# Patient Record
Sex: Male | Born: 1955 | Race: White | Hispanic: No | Marital: Married | Smoking: Never smoker
Health system: Southern US, Community
[De-identification: ages and names within clinical notes are randomized; demographics above are authoritative.]

## PROBLEM LIST (undated history)

## (undated) DIAGNOSIS — M1990 Unspecified osteoarthritis, unspecified site: Secondary | ICD-10-CM

## (undated) DIAGNOSIS — I10 Essential (primary) hypertension: Secondary | ICD-10-CM

## (undated) DIAGNOSIS — K219 Gastro-esophageal reflux disease without esophagitis: Secondary | ICD-10-CM

## (undated) DIAGNOSIS — E119 Type 2 diabetes mellitus without complications: Secondary | ICD-10-CM

## (undated) HISTORY — PX: OTHER SURGICAL HISTORY: SHX169

---

## 2005-10-01 ENCOUNTER — Ambulatory Visit (HOSPITAL_BASED_OUTPATIENT_CLINIC_OR_DEPARTMENT_OTHER): Admission: RE | Admit: 2005-10-01 | Discharge: 2005-10-01 | Payer: Self-pay | Admitting: Plastic Surgery

## 2005-10-01 ENCOUNTER — Encounter (INDEPENDENT_AMBULATORY_CARE_PROVIDER_SITE_OTHER): Payer: Self-pay | Admitting: Specialist

## 2009-10-10 ENCOUNTER — Encounter: Admission: RE | Admit: 2009-10-10 | Discharge: 2009-11-07 | Payer: Self-pay | Admitting: Family Medicine

## 2010-09-05 ENCOUNTER — Emergency Department (HOSPITAL_COMMUNITY): Admission: EM | Admit: 2010-09-05 | Discharge: 2010-09-05 | Payer: Self-pay | Admitting: Emergency Medicine

## 2011-03-09 NOTE — Op Note (Signed)
NAME:  Mcevers, [name]                 ACCOUNT NO.:  [id number]   MEDICAL RECORD NO.:  [id number]          PATIENT TYPE:  AMB   LOCATION:  DSC                          FACILITY:  [facility]   PHYSICIAN:  Alfredia Ferguson, M.D.  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   OPERATION PERFORMED:  Partial excision of congenital nevus with primary  closure.   SURGEON:  Alfredia Ferguson, MD.   ANESTHESIA:  1% Xylocaine, 1:100,000 epinephrine, 20 mL used.   INDICATIONS FOR SURGERY:  This is a 55 year old male with a large congenital  nevus, lateral aspect of the left upper thigh.  It measures 9 cm x 3.5 cm.  The plan is to remove as much of this lesion as possible today.  It is  horizontally oriented so it will likely be impossible to remove all of the  lesion.  The patient understands that he will require a second surgery in  the future.  He also understands the risk of unsightly scarring.  It is  better that he wishes to proceed with the surgery.   DESCRIPTION OF OPERATION:  Skin markers were placed in an elliptical fashion  around the lesion.  Local anesthesia was infiltrated, and the area was  prepped with Betadine and draped with sterile drapes.  After waiting 10  minutes, the upper limb of my incision, and the pigmented nevus was  undermined at the level of the subcutaneous tissue going in an inferior  direction.  I undermined the lesion approximately 80% of the way.  The upper  limb of the incision was also undermined for a distance of a couple of  centimeters.  This appeared to be the limit of my excision due to tension.  The lesion was then excised at the inferior limit of my dissection.  The  specimen was submitted for  pathology.  The inferior wound edges were  undermined for a distance of a couple of centimeters to facilitate closure.  Hemostasis was meticulously maintained.  The wound was closed with multiple  interrupted 3-0 Monocryl sutures for the dermis followed by a running 3-0  Monocryl subcuticular suture.  Approximately 10 to 15% of the lesion  remains.  The area was cleansed and dried, and a light dressing was applied.  The patient was discharged home in satisfactory condition.      Alfredia Ferguson, M.D.  Electronically Signed     WBB/MEDQ  D:  10/01/2005  T:  10/01/2005  Job:  [id number]   cc:   [facility]

## 2011-10-25 ENCOUNTER — Other Ambulatory Visit: Payer: Self-pay | Admitting: Family Medicine

## 2011-10-25 DIAGNOSIS — R1011 Right upper quadrant pain: Secondary | ICD-10-CM

## 2011-11-02 ENCOUNTER — Ambulatory Visit
Admission: RE | Admit: 2011-11-02 | Discharge: 2011-11-02 | Disposition: A | Discharge status: Home / Self Care | Payer: BC Managed Care – PPO | Source: Ambulatory Visit | Attending: Family Medicine | Admitting: Family Medicine

## 2011-11-02 DIAGNOSIS — R1011 Right upper quadrant pain: Secondary | ICD-10-CM

## 2012-12-09 ENCOUNTER — Other Ambulatory Visit: Payer: Self-pay | Admitting: Orthopedic Surgery

## 2012-12-09 MED ORDER — BUPIVACAINE LIPOSOME 1.3 % IJ SUSP: 20.0000 mL | Freq: Once | INTRAMUSCULAR | Status: DC

## 2012-12-09 MED ORDER — DEXAMETHASONE SODIUM PHOSPHATE 10 MG/ML IJ SOLN: 10.0000 mg | Freq: Once | INTRAMUSCULAR | Status: DC

## 2012-12-09 NOTE — Progress Notes (Signed)
Preoperative surgical orders have been place into the Epic hospital system for Kevin Lucero on 12/09/2012, 11:48 AM  by Patrica Duel for surgery on 12/31/2012.  Preop Total Hip - Anterior Approach orders including Experel Injecion, IV Tylenol, and IV Decadron as long as there are no contraindications to the above medications. Avel Peace, PA-C

## 2012-12-16 ENCOUNTER — Other Ambulatory Visit: Payer: Self-pay | Admitting: Orthopedic Surgery

## 2012-12-16 NOTE — H&P (Signed)
Kevin Lucero  DOB: 1956-04-10 Married / Language: English / Race: White Male  Date of Admission:  12/31/2012  Chief Complaint:  Right Hip Pain  History of Present Illness The patient is a 57 year old male who comes in for a preoperative History and Physical. The patient is scheduled for a right total hip arthroplasty (Anterior Approach) to be performed by Dr. Gus Rankin. Aluisio, MD at [facility] on 12/31/12. The patient is a 57 year old male who presents for follow up of their hip. The patient is being followed for their right hip pain and osteoarthritis. Symptoms reported today include: pain and stiffness. The patient feels that they are doing poorly and report their pain level to be mild to moderate. The following medication has been used for pain control: none. Kevin Lucero feels the hip has gotten worse since being seen in March. He has more limitations in motion and more limitations in function. There is worsening pain. He is unable to go on walks anymore. The hip will hurt at night. The more activity he does during the day, the worse it hurts at night. He is not having any back pain or lower extremity weakness or paresthesia with this. The left hip will also hurt on occasion but no where near as bad as the right. The hip is now essentially taking over his life as to what he can or can not do. He is ready to get the hip replaced at this time. He discussed doing an anterior approach. We did discuss the intricacies of this and he wants to go ahead and proceed with a total hip arthroplasty via anterior approach. He is an excellent candidate for that based on his lack of significant deformity and based on body habitus. They have been treated conservatively in the past for the above stated problem and despite conservative measures, they continue to have progressive pain and severe functional limitations and dysfunction. They have failed non-operative management including home exercise,  medications, and injections. It is felt that they would benefit from undergoing total joint replacement. Risks and benefits of the procedure have been discussed with the patient and they elect to proceed with surgery. There are no active contraindications to surgery such as ongoing infection or rapidly progressive neurological disease.   Problem List Osteoarthritis, Hip (715.35)   Allergies Penicillins. fevers, "bad dreams"   Family History Rheumatoid Arthritis. father Hypertension. mother and father Diabetes Mellitus. 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 greater than 5 Marital status. married Illicit drug use. no Living situation. live with spouse Pain Contract. no Tobacco use. never smoker; smoke(d) less than 1/2 pack(s) per day; uses less than half 1/2 can(s) smokeless per week Previously in rehab. no Tobacco / smoke exposure. no Drug/Alcohol Rehab (Currently). no Exercise. Exercises daily; does running / walking, individual sport and gym / weights Current work status. working full time Alcohol use. current drinker; drinks beer, wine and hard liquor; 8-14 per week Children. 2 Post-Surgical Plans. Plan is for home.   Medication History Fenofibrate Micronized (200MG  Capsule, Oral) Active. Metoprolol Succinate (50MG  Tablet ER 24HR, Oral) Active. Lisinopril (10MG  Tablet, Oral) Active.   Past Surgical History No pertinent past surgical history   Medical History High blood pressure Diet-Controlled Diabetes Mellitus. Borderline   Review of Systems General:Not Present- Chills, Fever, Night Sweats, Fatigue, Weight Gain, Weight Loss and Memory Loss. Skin:Not Present- Hives, Itching,  Rash, Eczema and Lesions. HEENT:Not Present- Tinnitus, Headache, Double Vision, Visual Loss, Hearing Loss and Dentures. Respiratory:Not  Present- Shortness of breath with exertion, Shortness of breath at rest, Allergies, Coughing up blood and Chronic Cough. Cardiovascular:Not Present- Chest Pain, Racing/skipping heartbeats, Difficulty Breathing Lying Down, Murmur, Swelling and Palpitations. Gastrointestinal:Not Present- Bloody Stool, Heartburn, Abdominal Pain, Vomiting, Nausea, Constipation, Diarrhea, Difficulty Swallowing, Jaundice and Loss of appetitie. Male Genitourinary:Not Present- Urinary frequency, Blood in Urine, Weak urinary stream, Discharge, Flank Pain, Incontinence, Painful Urination, Urgency, Urinary Retention and Urinating at Night. Musculoskeletal:Present- Joint Pain. Not Present- Muscle Weakness, Muscle Pain, Joint Swelling, Back Pain, Morning Stiffness and Spasms. Neurological:Not Present- Tremor, Dizziness, Blackout spells, Paralysis, Difficulty with balance and Weakness. Psychiatric:Not Present- Insomnia.   Vitals Pulse: 76 (Regular) Resp.: 16 (Unlabored) BP: 132/70 (Sitting, Left Arm, Standard)    Physical Exam The physical exam findings are as follows:   General Mental Status - Alert, cooperative and good historian. General Appearance- pleasant. Not in acute distress. Orientation- Oriented X3. Build & Nutrition- Well nourished and Well developed.   Head and Neck Head- normocephalic, atraumatic . Neck Global Assessment- supple. no bruit auscultated on the right and no bruit auscultated on the left.   Eye Pupil- Bilateral- Regular and Round. Motion- Bilateral- EOMI.   Chest and Lung Exam Auscultation: Breath sounds:- clear at anterior chest wall and - clear at posterior chest wall. Adventitious sounds:- No Adventitious sounds.   Cardiovascular Auscultation:Rhythm- Regular rate and rhythm. Heart Sounds- S1 WNL and S2 WNL. Murmurs & Other Heart Sounds:Auscultation of the heart reveals - No Murmurs.   Abdomen Palpation/Percussion:Tenderness-  Abdomen is non-tender to palpation. Rigidity (guarding)- Abdomen is soft. Auscultation:Auscultation of the abdomen reveals - Bowel sounds normal.   Male Genitourinary Not done, not pertinent to present illness  Musculoskeletal He is a well developed male. He is alert and oriented. No apparent distress. He walks with a slightly antalgic gait pattern on the right. His left hip has flexion to 95 degrees, about 5 internal rotation, 20 external rotation and 20 abduction. The right hip flexion to 90. No internal rotation and about 5 external rotation and about 15 abduction. The knee exam is normal bilaterally. Pulse, sensation and motor are intact in both lower extremities.  RADIOGRAPHS: AP of the pelvis and lateral of the right hip shows severe bone on bone arthritis of the right hip with some erosion of the femoral head. This is slightly worse than back in March. He also has significant narrowing in the left hip. This has been unchanged since March.  Assessment & Plan Osteoarthritis, Hip (715.35) Impression: Right Hip  Note: Plan is for a Right Total Hip Replacement - Anterior Approach by Dr. Lequita Halt.  Plan is to go home.  PCP - Dr. Selena Batten Cardiology - Dr. Eldridge Dace  Signed electronically by Roberts Gaudy, PA-C

## 2012-12-17 ENCOUNTER — Encounter (HOSPITAL_COMMUNITY)
Admission: RE | Admit: 2012-12-17 | Discharge: 2012-12-17 | Disposition: A | Discharge status: Home / Self Care | Payer: BC Managed Care – PPO | Source: Ambulatory Visit | Attending: Orthopedic Surgery | Admitting: Orthopedic Surgery

## 2012-12-17 ENCOUNTER — Ambulatory Visit (HOSPITAL_COMMUNITY)
Admission: RE | Admit: 2012-12-17 | Discharge: 2012-12-17 | Disposition: A | Discharge status: Home / Self Care | Payer: BC Managed Care – PPO | Source: Ambulatory Visit | Attending: Orthopedic Surgery | Admitting: Orthopedic Surgery

## 2012-12-17 ENCOUNTER — Encounter (HOSPITAL_COMMUNITY): Payer: Self-pay | Admitting: Pharmacy Technician

## 2012-12-17 ENCOUNTER — Encounter (HOSPITAL_COMMUNITY): Payer: Self-pay

## 2012-12-17 DIAGNOSIS — Z01810 Encounter for preprocedural cardiovascular examination: Secondary | ICD-10-CM | POA: Insufficient documentation

## 2012-12-17 DIAGNOSIS — Z01812 Encounter for preprocedural laboratory examination: Secondary | ICD-10-CM | POA: Insufficient documentation

## 2012-12-17 DIAGNOSIS — I10 Essential (primary) hypertension: Secondary | ICD-10-CM | POA: Insufficient documentation

## 2012-12-17 DIAGNOSIS — Z01818 Encounter for other preprocedural examination: Secondary | ICD-10-CM | POA: Insufficient documentation

## 2012-12-17 HISTORY — DX: Gastro-esophageal reflux disease without esophagitis: K21.9

## 2012-12-17 HISTORY — DX: Unspecified osteoarthritis, unspecified site: M19.90

## 2012-12-17 HISTORY — DX: Essential (primary) hypertension: I10

## 2012-12-17 HISTORY — DX: Type 2 diabetes mellitus without complications: E11.9

## 2012-12-17 LAB — URINALYSIS, ROUTINE W REFLEX MICROSCOPIC
Bilirubin Urine: NEGATIVE
Glucose, UA: NEGATIVE mg/dL
Hgb urine dipstick: NEGATIVE
Ketones, ur: NEGATIVE mg/dL
Leukocytes, UA: NEGATIVE
Nitrite: NEGATIVE
Protein, ur: NEGATIVE mg/dL
Specific Gravity, Urine: 1.022 (ref 1.005–1.030)
Urobilinogen, UA: 0.2 mg/dL (ref 0.0–1.0)
pH: 6.5 (ref 5.0–8.0)

## 2012-12-17 LAB — SURGICAL PCR SCREEN
MRSA, PCR: NEGATIVE
Staphylococcus aureus: POSITIVE — AB

## 2012-12-17 LAB — CBC
HCT: 46 % (ref 39.0–52.0)
Hemoglobin: 15.5 g/dL (ref 13.0–17.0)
MCH: 30.9 pg (ref 26.0–34.0)
MCHC: 33.7 g/dL (ref 30.0–36.0)
MCV: 91.8 fL (ref 78.0–100.0)
Platelets: 305 10*3/uL (ref 150–400)
RBC: 5.01 MIL/uL (ref 4.22–5.81)
RDW: 12.9 % (ref 11.5–15.5)
WBC: 6 10*3/uL (ref 4.0–10.5)

## 2012-12-17 LAB — COMPREHENSIVE METABOLIC PANEL
ALT: 31 U/L (ref 0–53)
AST: 38 U/L — ABNORMAL HIGH (ref 0–37)
Albumin: 4.2 g/dL (ref 3.5–5.2)
Alkaline Phosphatase: 42 U/L (ref 39–117)
BUN: 19 mg/dL (ref 6–23)
CO2: 24 mEq/L (ref 19–32)
Calcium: 9.8 mg/dL (ref 8.4–10.5)
Chloride: 104 mEq/L (ref 96–112)
Creatinine, Ser: 1.25 mg/dL (ref 0.50–1.35)
GFR calc Af Amer: 73 mL/min — ABNORMAL LOW (ref >90)
GFR calc non Af Amer: 63 mL/min — ABNORMAL LOW (ref >90)
Glucose, Bld: 105 mg/dL — ABNORMAL HIGH (ref 70–99)
Potassium: 3.9 mEq/L (ref 3.5–5.1)
Sodium: 138 mEq/L (ref 135–145)
Total Bilirubin: 0.4 mg/dL (ref 0.3–1.2)
Total Protein: 7.8 g/dL (ref 6.0–8.3)

## 2012-12-17 LAB — PROTIME-INR
INR: 0.94 (ref 0.00–1.49)
Prothrombin Time: 12.5 seconds (ref 11.6–15.2)

## 2012-12-17 LAB — APTT: aPTT: 40 seconds — ABNORMAL HIGH (ref 24–37)

## 2012-12-17 NOTE — Progress Notes (Signed)
12/17/12 1659  OBSTRUCTIVE SLEEP APNEA  Have you ever been diagnosed with sleep apnea through a sleep study? No  Do you snore loudly (loud enough to be heard through closed doors)?  0  Do you often feel tired, fatigued, or sleepy during the daytime? 1  Has anyone observed you stop breathing during your sleep? 0  Do you have, or are you being treated for high blood pressure? 1  BMI more than 35 kg/m2? 0  Age over 57 years old? 1  Neck circumference greater than 40 cm/18 inches? 0  Gender: 1  Obstructive Sleep Apnea Score 4  Score 4 or greater  Results sent to PCP

## 2012-12-17 NOTE — Patient Instructions (Signed)
YOUR SURGERY IS SCHEDULED AT [facility]  ON:   WED  3/12  REPORT TO [facility] AT:  8:15 AM      PHONE # FOR SHORT STAY IS [phone]  DO NOT EAT OR DRINK ANYTHING AFTER MIDNIGHT THE NIGHT BEFORE YOUR SURGERY.  YOU MAY BRUSH YOUR TEETH, RINSE OUT YOUR MOUTH--BUT NO WATER, NO FOOD, NO CHEWING GUM, NO MINTS, NO CANDIES, NO CHEWING TOBACCO.  PLEASE TAKE THE FOLLOWING MEDICATIONS THE AM OF YOUR SURGERY WITH A FEW SIPS OF WATER:  METOPROLOL  IF YOU USE INHALERS--USE YOUR INHALERS THE AM OF YOUR SURGERY AND BRING INHALERS TO THE HOSPITAL.    IF YOU ARE DIABETIC:  DO NOT TAKE ANY DIABETIC MEDICATIONS THE AM OF YOUR SURGERY.  IF YOU TAKE INSULIN IN THE EVENINGS--PLEASE ONLY TAKE 1/2 NORMAL EVENING DOSE THE NIGHT BEFORE YOUR SURGERY.  NO INSULIN THE AM OF YOUR SURGERY.  IF YOU HAVE SLEEP APNEA AND USE CPAP OR BIPAP--PLEASE BRING THE MASK AND THE TUBING.  DO NOT BRING YOUR MACHINE.  DO NOT BRING VALUABLES, MONEY, CREDIT CARDS.  DO NOT WEAR JEWELRY, MAKE-UP, NAIL POLISH AND NO METAL PINS OR CLIPS IN YOUR HAIR. CONTACT LENS, DENTURES / PARTIALS, GLASSES SHOULD NOT BE WORN TO SURGERY AND IN MOST CASES-HEARING AIDS WILL NEED TO BE REMOVED.  BRING YOUR GLASSES CASE, ANY EQUIPMENT NEEDED FOR YOUR CONTACT LENS. FOR PATIENTS ADMITTED TO THE HOSPITAL--CHECK OUT TIME THE DAY OF DISCHARGE IS 11:00 AM.  ALL INPATIENT ROOMS ARE PRIVATE - WITH BATHROOM, TELEPHONE, TELEVISION AND WIFI INTERNET.  IF YOU ARE BEING DISCHARGED THE SAME DAY OF YOUR SURGERY--YOU CAN NOT DRIVE YOURSELF HOME--AND SHOULD NOT GO HOME ALONE BY TAXI OR BUS.  NO DRIVING OR OPERATING MACHINERY FOR 24 HOURS FOLLOWING ANESTHESIA / PAIN MEDICATIONS.  PLEASE MAKE ARRANGEMENTS FOR SOMEONE TO BE WITH YOU AT HOME THE FIRST 24 HOURS AFTER SURGERY. RESPONSIBLE DRIVER'S NAME___________________________                                               PHONE #   _______________________                               PLEASE READ OVER  ANY  FACT SHEETS THAT YOU WERE GIVEN: MRSA INFORMATION, BLOOD TRANSFUSION INFORMATION, INCENTIVE SPIROMETER INFORMATION. FAILURE TO FOLLOW THESE INSTRUCTIONS MAY RESULT IN THE CANCELLATION OF YOUR SURGERY.   PATIENT SIGNATURE_________________________________

## 2012-12-17 NOTE — Pre-Procedure Instructions (Signed)
CARDIOLOGY OFFICE NOTES WITH EKG REPORT AND CLEARANCE FOR HIP REPLACEMENT -DATED 11/28/12 - RECEIVED FROM DR. VARANASI AND ON PT'S CHART. PT'S PREOP PT, INR AND PTT REPORTS FAXED TO DR. ALUISIO'S OFFICE-PTT ELEVATED AT 40, PT, INR NORMAL.

## 2012-12-17 NOTE — Pre-Procedure Instructions (Signed)
PREOP CBC, CMET, PT, PTT, UA, RIGHT HIP XRAY WERE DONE TODAY AT [facility] AS PER ORDERS DR. Lequita Halt.  T/S WILL BE DONE DAY OF SURGERY- PT IS GOING OUT OF TOWN AND NOT ABLE TO WEAR BLOOD BRACELET BEFORE SURGERY. PT STATES HIS MEDICAL DOCTOR SENT HIM TO CARDIOLOGIST -DR. VARANASI BECAUSE HE WAS HAVING A BIG SURGERY -STATES THE CARDIOLOGIST DID NOT DO EKG-TALKED TO HIM AND FELT THAT HE WAS OK TO HAVE SURGERY.  OFFICE NOTES HAVE BEEN REQUESTED FROM CARDIOLOGIST. EKG AND CXR WERE DONE PREOP TODAY - AS PER ANESTHESIOLOGIST'S GUIDELINES-PT HAS HYPERTENSION.

## 2012-12-19 NOTE — Pre-Procedure Instructions (Signed)
Faxed note received from Dr. Lequita Halt- preop ptt ok - no action.

## 2012-12-31 ENCOUNTER — Encounter (HOSPITAL_COMMUNITY): Payer: Self-pay | Admitting: Anesthesiology

## 2012-12-31 ENCOUNTER — Ambulatory Visit (HOSPITAL_COMMUNITY): Payer: BC Managed Care – PPO | Admitting: Anesthesiology

## 2012-12-31 ENCOUNTER — Ambulatory Visit (HOSPITAL_COMMUNITY): Payer: BC Managed Care – PPO

## 2012-12-31 ENCOUNTER — Inpatient Hospital Stay (HOSPITAL_COMMUNITY): Payer: BC Managed Care – PPO

## 2012-12-31 ENCOUNTER — Inpatient Hospital Stay (HOSPITAL_COMMUNITY)
Admission: RE | Admit: 2012-12-31 | Discharge: 2013-01-02 | DRG: 818 | Disposition: A | Discharge status: Home Health | Payer: BC Managed Care – PPO | Source: Ambulatory Visit | Attending: Orthopedic Surgery | Admitting: Orthopedic Surgery

## 2012-12-31 ENCOUNTER — Encounter (HOSPITAL_COMMUNITY)
Admission: RE | Disposition: A | Discharge status: Home Health | Payer: Self-pay | Source: Ambulatory Visit | Attending: Orthopedic Surgery

## 2012-12-31 ENCOUNTER — Encounter (HOSPITAL_COMMUNITY): Payer: Self-pay | Admitting: General Practice

## 2012-12-31 DIAGNOSIS — M1610 Unilateral primary osteoarthritis, unspecified hip: Principal | ICD-10-CM | POA: Diagnosis present

## 2012-12-31 DIAGNOSIS — Z96649 Presence of unspecified artificial hip joint: Secondary | ICD-10-CM

## 2012-12-31 DIAGNOSIS — M169 Osteoarthritis of hip, unspecified: Secondary | ICD-10-CM | POA: Diagnosis present

## 2012-12-31 DIAGNOSIS — I10 Essential (primary) hypertension: Secondary | ICD-10-CM | POA: Diagnosis present

## 2012-12-31 DIAGNOSIS — E119 Type 2 diabetes mellitus without complications: Secondary | ICD-10-CM | POA: Diagnosis present

## 2012-12-31 HISTORY — PX: TOTAL HIP ARTHROPLASTY: SHX124

## 2012-12-31 LAB — TYPE AND SCREEN
ABO/RH(D): A POS
Antibody Screen: NEGATIVE

## 2012-12-31 LAB — GLUCOSE, CAPILLARY: Glucose-Capillary: 112 mg/dL — ABNORMAL HIGH (ref 70–99)

## 2012-12-31 LAB — ABO/RH: ABO/RH(D): A POS

## 2012-12-31 SURGERY — ARTHROPLASTY, HIP, TOTAL, ANTERIOR APPROACH
Anesthesia: Spinal | Site: Hip | Laterality: Right | Wound class: Clean

## 2012-12-31 MED ORDER — BUPIVACAINE LIPOSOME 1.3 % IJ SUSP
20.0000 mL | Freq: Once | INTRAMUSCULAR | Status: DC
  Filled 2012-12-31: qty 20

## 2012-12-31 MED ORDER — POLYETHYLENE GLYCOL 3350 17 G PO PACK: 17.0000 g | PACK | Freq: Every day | ORAL | Status: DC | PRN

## 2012-12-31 MED ORDER — CEFAZOLIN SODIUM-DEXTROSE 2-3 GM-% IV SOLR
2.0000 g | Freq: Four times a day (QID) | INTRAVENOUS | Status: AC
  Administered 2012-12-31 (×2): 2 g via INTRAVENOUS
  Filled 2012-12-31 (×2): qty 50

## 2012-12-31 MED ORDER — FLEET ENEMA 7-19 GM/118ML RE ENEM: 1.0000 | ENEMA | Freq: Once | RECTAL | Status: AC | PRN

## 2012-12-31 MED ORDER — OXYCODONE HCL 5 MG PO TABS
5.0000 mg | ORAL_TABLET | ORAL | Status: DC | PRN
Start: 2012-12-31 — End: 2013-01-02
  Administered 2012-12-31 – 2013-01-02 (×11): 10 mg via ORAL
  Filled 2012-12-31 (×11): qty 2

## 2012-12-31 MED ORDER — METOPROLOL SUCCINATE ER 50 MG PO TB24
75.0000 mg | ORAL_TABLET | Freq: Every day | ORAL | Status: DC
  Administered 2013-01-01: 75 mg via ORAL
  Filled 2012-12-31 (×2): qty 1

## 2012-12-31 MED ORDER — DOCUSATE SODIUM 100 MG PO CAPS
100.0000 mg | ORAL_CAPSULE | Freq: Two times a day (BID) | ORAL | Status: DC
  Administered 2012-12-31 – 2013-01-01 (×3): 100 mg via ORAL

## 2012-12-31 MED ORDER — HYDROMORPHONE HCL PF 1 MG/ML IJ SOLN
INTRAMUSCULAR | Status: AC
  Filled 2012-12-31: qty 1

## 2012-12-31 MED ORDER — PROPOFOL 10 MG/ML IV EMUL
INTRAVENOUS | Status: DC | PRN
  Administered 2012-12-31: 100 ug/kg/min via INTRAVENOUS

## 2012-12-31 MED ORDER — METOCLOPRAMIDE HCL 5 MG/ML IJ SOLN: 5.0000 mg | Freq: Three times a day (TID) | INTRAMUSCULAR | Status: DC | PRN

## 2012-12-31 MED ORDER — DEXAMETHASONE SODIUM PHOSPHATE 10 MG/ML IJ SOLN: 10.0000 mg | Freq: Once | INTRAMUSCULAR | Status: AC

## 2012-12-31 MED ORDER — ONDANSETRON HCL 4 MG/2ML IJ SOLN: 4.0000 mg | Freq: Four times a day (QID) | INTRAMUSCULAR | Status: DC | PRN

## 2012-12-31 MED ORDER — ONDANSETRON HCL 4 MG PO TABS: 4.0000 mg | ORAL_TABLET | Freq: Four times a day (QID) | ORAL | Status: DC | PRN

## 2012-12-31 MED ORDER — CHLORHEXIDINE GLUCONATE 4 % EX LIQD: 60.0000 mL | Freq: Once | CUTANEOUS | Status: DC

## 2012-12-31 MED ORDER — SODIUM CHLORIDE 0.9 % IV SOLN: INTRAVENOUS | Status: DC

## 2012-12-31 MED ORDER — MIDAZOLAM HCL 5 MG/5ML IJ SOLN
INTRAMUSCULAR | Status: DC | PRN
  Administered 2012-12-31: 2 mg via INTRAVENOUS

## 2012-12-31 MED ORDER — BUPIVACAINE HCL (PF) 0.5 % IJ SOLN
INTRAMUSCULAR | Status: DC | PRN
  Administered 2012-12-31: 3 mL

## 2012-12-31 MED ORDER — ACETAMINOPHEN 10 MG/ML IV SOLN
1000.0000 mg | Freq: Four times a day (QID) | INTRAVENOUS | Status: AC
  Administered 2012-12-31 – 2013-01-01 (×4): 1000 mg via INTRAVENOUS
  Filled 2012-12-31 (×6): qty 100

## 2012-12-31 MED ORDER — CEFAZOLIN SODIUM-DEXTROSE 2-3 GM-% IV SOLR
INTRAVENOUS | Status: AC
  Filled 2012-12-31: qty 50

## 2012-12-31 MED ORDER — FENOFIBRATE 160 MG PO TABS
160.0000 mg | ORAL_TABLET | Freq: Every day | ORAL | Status: DC
  Administered 2013-01-01: 160 mg via ORAL
  Filled 2012-12-31 (×3): qty 1

## 2012-12-31 MED ORDER — BISACODYL 10 MG RE SUPP: 10.0000 mg | Freq: Every day | RECTAL | Status: DC | PRN

## 2012-12-31 MED ORDER — LACTATED RINGERS IV SOLN: INTRAVENOUS | Status: DC

## 2012-12-31 MED ORDER — BUPIVACAINE HCL (PF) 0.5 % IJ SOLN
INTRAMUSCULAR | Status: AC
  Filled 2012-12-31: qty 30

## 2012-12-31 MED ORDER — ACETAMINOPHEN 325 MG PO TABS
650.0000 mg | ORAL_TABLET | Freq: Four times a day (QID) | ORAL | Status: DC | PRN
  Administered 2013-01-01: 650 mg via ORAL
  Filled 2012-12-31: qty 2

## 2012-12-31 MED ORDER — ACETAMINOPHEN 650 MG RE SUPP: 650.0000 mg | Freq: Four times a day (QID) | RECTAL | Status: DC | PRN

## 2012-12-31 MED ORDER — TRAMADOL HCL 50 MG PO TABS
50.0000 mg | ORAL_TABLET | Freq: Four times a day (QID) | ORAL | Status: DC | PRN
  Administered 2013-01-01: 100 mg via ORAL
  Filled 2012-12-31: qty 2

## 2012-12-31 MED ORDER — CEFAZOLIN SODIUM-DEXTROSE 2-3 GM-% IV SOLR
2.0000 g | INTRAVENOUS | Status: AC
  Administered 2012-12-31: 2 g via INTRAVENOUS

## 2012-12-31 MED ORDER — MORPHINE SULFATE 2 MG/ML IJ SOLN
1.0000 mg | INTRAMUSCULAR | Status: DC | PRN
  Administered 2012-12-31: 2 mg via INTRAVENOUS
  Administered 2012-12-31: 1 mg via INTRAVENOUS
  Administered 2012-12-31: 2 mg via INTRAVENOUS
  Filled 2012-12-31 (×3): qty 1

## 2012-12-31 MED ORDER — HYDROMORPHONE HCL PF 1 MG/ML IJ SOLN
0.2500 mg | INTRAMUSCULAR | Status: DC | PRN
  Administered 2012-12-31 (×6): 0.5 mg via INTRAVENOUS

## 2012-12-31 MED ORDER — ACETAMINOPHEN 10 MG/ML IV SOLN
INTRAVENOUS | Status: DC | PRN
  Administered 2012-12-31: 1000 mg via INTRAVENOUS

## 2012-12-31 MED ORDER — BUPIVACAINE LIPOSOME 1.3 % IJ SUSP: INTRAMUSCULAR | Status: DC | PRN

## 2012-12-31 MED ORDER — HYDROMORPHONE HCL PF 1 MG/ML IJ SOLN: 0.2500 mg | INTRAMUSCULAR | Status: DC | PRN

## 2012-12-31 MED ORDER — METHOCARBAMOL 500 MG PO TABS
500.0000 mg | ORAL_TABLET | Freq: Four times a day (QID) | ORAL | Status: DC | PRN
  Administered 2012-12-31 – 2013-01-01 (×4): 500 mg via ORAL
  Filled 2012-12-31 (×4): qty 1

## 2012-12-31 MED ORDER — ACETAMINOPHEN 10 MG/ML IV SOLN: 1000.0000 mg | Freq: Once | INTRAVENOUS | Status: DC

## 2012-12-31 MED ORDER — MENTHOL 3 MG MT LOZG: 1.0000 | LOZENGE | OROMUCOSAL | Status: DC | PRN

## 2012-12-31 MED ORDER — ONDANSETRON HCL 4 MG/2ML IJ SOLN
INTRAMUSCULAR | Status: DC | PRN
  Administered 2012-12-31: 4 mg via INTRAVENOUS

## 2012-12-31 MED ORDER — ATORVASTATIN CALCIUM 40 MG PO TABS
40.0000 mg | ORAL_TABLET | Freq: Every day | ORAL | Status: DC
  Administered 2013-01-01: 40 mg via ORAL
  Filled 2012-12-31 (×3): qty 1

## 2012-12-31 MED ORDER — PHENYLEPHRINE HCL 10 MG/ML IJ SOLN
INTRAMUSCULAR | Status: DC | PRN
  Administered 2012-12-31 (×2): 40 ug via INTRAVENOUS

## 2012-12-31 MED ORDER — POTASSIUM CHLORIDE IN NACL 20-0.9 MEQ/L-% IV SOLN
INTRAVENOUS | Status: DC
  Administered 2012-12-31: 22:00:00 via INTRAVENOUS
  Filled 2012-12-31 (×3): qty 1000

## 2012-12-31 MED ORDER — METOCLOPRAMIDE HCL 10 MG PO TABS: 5.0000 mg | ORAL_TABLET | Freq: Three times a day (TID) | ORAL | Status: DC | PRN

## 2012-12-31 MED ORDER — PHENOL 1.4 % MT LIQD: 1.0000 | OROMUCOSAL | Status: DC | PRN

## 2012-12-31 MED ORDER — SODIUM CHLORIDE 0.9 % IJ SOLN
INTRAMUSCULAR | Status: DC | PRN
  Administered 2012-12-31: 12:00:00

## 2012-12-31 MED ORDER — METHOCARBAMOL 100 MG/ML IJ SOLN: 500.0000 mg | Freq: Four times a day (QID) | INTRAVENOUS | Status: DC | PRN

## 2012-12-31 MED ORDER — FENTANYL CITRATE 0.05 MG/ML IJ SOLN
INTRAMUSCULAR | Status: DC | PRN
  Administered 2012-12-31 (×3): 25 ug via INTRAVENOUS
  Administered 2012-12-31: 50 ug via INTRAVENOUS
  Administered 2012-12-31 (×2): 25 ug via INTRAVENOUS

## 2012-12-31 MED ORDER — 0.9 % SODIUM CHLORIDE (POUR BTL) OPTIME
TOPICAL | Status: DC | PRN
  Administered 2012-12-31: 1000 mL

## 2012-12-31 MED ORDER — DEXAMETHASONE 6 MG PO TABS
10.0000 mg | ORAL_TABLET | Freq: Once | ORAL | Status: AC
  Administered 2013-01-01: 10 mg via ORAL
  Filled 2012-12-31: qty 1

## 2012-12-31 MED ORDER — DIPHENHYDRAMINE HCL 12.5 MG/5ML PO ELIX: 12.5000 mg | ORAL_SOLUTION | ORAL | Status: DC | PRN

## 2012-12-31 MED ORDER — RIVAROXABAN 10 MG PO TABS
10.0000 mg | ORAL_TABLET | Freq: Every day | ORAL | Status: DC
  Administered 2013-01-01 – 2013-01-02 (×2): 10 mg via ORAL
  Filled 2012-12-31 (×3): qty 1

## 2012-12-31 MED ORDER — MEPERIDINE HCL 50 MG/ML IJ SOLN: 6.2500 mg | INTRAMUSCULAR | Status: DC | PRN

## 2012-12-31 MED ORDER — LACTATED RINGERS IV SOLN
INTRAVENOUS | Status: DC
  Administered 2012-12-31: 13:00:00 via INTRAVENOUS
  Administered 2012-12-31: 1000 mL via INTRAVENOUS
  Administered 2012-12-31 (×2): via INTRAVENOUS
  Administered 2012-12-31: 1000 mL via INTRAVENOUS
  Administered 2012-12-31: 13:00:00 via INTRAVENOUS

## 2012-12-31 MED ORDER — ACETAMINOPHEN 10 MG/ML IV SOLN
INTRAVENOUS | Status: AC
  Filled 2012-12-31: qty 100

## 2012-12-31 MED ORDER — EPHEDRINE SULFATE 50 MG/ML IJ SOLN
INTRAMUSCULAR | Status: DC | PRN
  Administered 2012-12-31 (×2): 5 mg via INTRAVENOUS
  Administered 2012-12-31: 10 mg via INTRAVENOUS
  Administered 2012-12-31 (×2): 5 mg via INTRAVENOUS
  Administered 2012-12-31 (×2): 10 mg via INTRAVENOUS

## 2012-12-31 MED ORDER — PROMETHAZINE HCL 25 MG/ML IJ SOLN: 6.2500 mg | INTRAMUSCULAR | Status: DC | PRN

## 2012-12-31 SURGICAL SUPPLY — 40 items
BAG ZIPLOCK 12X15 (MISCELLANEOUS) ×4 IMPLANT
BLADE SAW SGTL 18X1.27X75 (BLADE) ×2 IMPLANT
CLOTH BEACON ORANGE TIMEOUT ST (SAFETY) ×2 IMPLANT
CLSR STERI-STRIP ANTIMIC 1/2X4 (GAUZE/BANDAGES/DRESSINGS) ×4 IMPLANT
DECANTER SPIKE VIAL GLASS SM (MISCELLANEOUS) ×2 IMPLANT
DRAPE C-ARM 42X72 X-RAY (DRAPES) ×2 IMPLANT
DRAPE STERI IOBAN 125X83 (DRAPES) ×2 IMPLANT
DRAPE U-SHAPE 47X51 STRL (DRAPES) ×6 IMPLANT
DRSG ADAPTIC 3X8 NADH LF (GAUZE/BANDAGES/DRESSINGS) ×2 IMPLANT
DRSG MEPILEX BORDER 4X4 (GAUZE/BANDAGES/DRESSINGS) ×2 IMPLANT
DRSG MEPILEX BORDER 4X8 (GAUZE/BANDAGES/DRESSINGS) ×2 IMPLANT
DURAPREP 26ML APPLICATOR (WOUND CARE) ×2 IMPLANT
ELECT BLADE 6.5 EXT (BLADE) ×2 IMPLANT
ELECT REM PT RETURN 9FT ADLT (ELECTROSURGICAL) ×2
ELECTRODE REM PT RTRN 9FT ADLT (ELECTROSURGICAL) ×1 IMPLANT
EVACUATOR 1/8 PVC DRAIN (DRAIN) IMPLANT
FACESHIELD LNG OPTICON STERILE (SAFETY) ×8 IMPLANT
GLOVE BIO SURGEON STRL SZ7.5 (GLOVE) ×2 IMPLANT
GLOVE BIO SURGEON STRL SZ8 (GLOVE) ×4 IMPLANT
GLOVE BIOGEL PI IND STRL 8 (GLOVE) ×2 IMPLANT
GLOVE BIOGEL PI INDICATOR 8 (GLOVE) ×2
GOWN STRL NON-REIN LRG LVL3 (GOWN DISPOSABLE) ×2 IMPLANT
GOWN STRL REIN XL XLG (GOWN DISPOSABLE) ×2 IMPLANT
KIT BASIN OR (CUSTOM PROCEDURE TRAY) ×2 IMPLANT
NDL SAFETY ECLIPSE 18X1.5 (NEEDLE) ×1 IMPLANT
NEEDLE HYPO 18GX1.5 SHARP (NEEDLE) ×1
PACK TOTAL JOINT (CUSTOM PROCEDURE TRAY) ×2 IMPLANT
PADDING CAST COTTON 6X4 STRL (CAST SUPPLIES) ×2 IMPLANT
SPONGE GAUZE 4X4 12PLY (GAUZE/BANDAGES/DRESSINGS) ×2 IMPLANT
SUCTION FRAZIER 12FR DISP (SUCTIONS) ×2 IMPLANT
SUT ETHIBOND NAB CT1 #1 30IN (SUTURE) ×6 IMPLANT
SUT MNCRL AB 4-0 PS2 18 (SUTURE) ×2 IMPLANT
SUT VIC AB 1 CT1 27 (SUTURE) ×1
SUT VIC AB 1 CT1 27XBRD ANTBC (SUTURE) ×1 IMPLANT
SUT VIC AB 2-0 CT1 27 (SUTURE) ×2
SUT VIC AB 2-0 CT1 TAPERPNT 27 (SUTURE) ×2 IMPLANT
SUT VLOC 180 0 24IN GS25 (SUTURE) ×2 IMPLANT
SYR 50ML LL SCALE MARK (SYRINGE) ×2 IMPLANT
TOWEL OR 17X26 10 PK STRL BLUE (TOWEL DISPOSABLE) ×4 IMPLANT
TRAY FOLEY CATH 14FRSI W/METER (CATHETERS) ×2 IMPLANT

## 2012-12-31 NOTE — Progress Notes (Signed)
On the post-op radiograph in the PACU it was noted that the right hip was subluxed laterally in comparison to the intra-op films. I felt that perhaps there was some soft tissue interposition between the femoral head and acetabular liner. The patient had no pain complaints and I sent him to [address] to let the spinal fully wear off prior to re-evaluating and possibly returning to the OR for exploration. I discussed that possibility with the patient and his family. Upon re-evaluation he stated that he felt a pop when he moved in bed and subsequent to that the hip felt much better and had a more normal feel to him. He still had no pain complaints and on exam I was able to take him through a fairly extensive range of motion without pain or mechanical issues such as popping or impingement. A repeat portable radiograph showed concentric reduction of the hip. We reviewed the series of radiographs in his room and will plan on beginning routine post-op PT in the AM.

## 2012-12-31 NOTE — Anesthesia Preprocedure Evaluation (Addendum)
Anesthesia Evaluation  Patient identified by MRN, date of birth, ID band Patient awake    Reviewed: Allergy & Precautions, H&P , NPO status , Patient's Chart, lab work & pertinent test results  Airway Mallampati: II TM Distance: >3 FB Neck ROM: Full    Dental no notable dental hx.    Pulmonary neg pulmonary ROS,  breath sounds clear to auscultation  Pulmonary exam normal       Cardiovascular hypertension, Pt. on medications Rhythm:Regular Rate:Normal     Neuro/Psych negative neurological ROS  negative psych ROS   GI/Hepatic negative GI ROS, Neg liver ROS,   Endo/Other  diabetes  Renal/GU negative Renal ROS  negative genitourinary   Musculoskeletal negative musculoskeletal ROS (+)   Abdominal   Peds negative pediatric ROS (+)  Hematology negative hematology ROS (+)   Anesthesia Other Findings   Reproductive/Obstetrics negative OB ROS                          Anesthesia Physical Anesthesia Plan  ASA: II  Anesthesia Plan: Spinal   Post-op Pain Management:    Induction: Intravenous  Airway Management Planned: Simple Face Mask  Additional Equipment:   Intra-op Plan:   Post-operative Plan:   Informed Consent: I have reviewed the patients History and Physical, chart, labs and discussed the procedure including the risks, benefits and alternatives for the proposed anesthesia with the patient or authorized representative who has indicated his/her understanding and acceptance.   Dental advisory given  Plan Discussed with: CRNA  Anesthesia Plan Comments:         Anesthesia Quick Evaluation

## 2012-12-31 NOTE — Transfer of Care (Signed)
Immediate Anesthesia Transfer of Care Note  Patient: Kevin Lucero  Procedure(s) Performed: Procedure(s): TOTAL HIP ARTHROPLASTY ANTERIOR APPROACH (Right)  Patient Location: PACU  Anesthesia Type:MAC and Spinal  Level of Consciousness: awake, alert  and oriented  Airway & Oxygen Therapy: Patient Spontanous Breathing and Patient connected to face mask oxygen  Post-op Assessment: Report given to PACU RN and Post -op Vital signs reviewed and stable  Post vital signs: Reviewed and stable  Complications: No apparent anesthesia complications

## 2012-12-31 NOTE — H&P (View-Only) (Signed)
Kevin Lucero  DOB: 07/21/1956 Married / Language: English / Race: White Male  Date of Admission:  12/31/2012  Chief Complaint:  Right Hip Pain  History of Present Illness The patient is a 56 year old male who comes in for a preoperative History and Physical. The patient is scheduled for a right total hip arthroplasty (Anterior Approach) to be performed by Dr. Frank V. Aluisio, MD at [facility] on 12/31/12. The patient is a 56 year old male who presents for follow up of their hip. The patient is being followed for their right hip pain and osteoarthritis. Symptoms reported today include: pain and stiffness. The patient feels that they are doing poorly and report their pain level to be mild to moderate. The following medication has been used for pain control: none. Kevin Lucero feels the hip has gotten worse since being seen in March. He has more limitations in motion and more limitations in function. There is worsening pain. He is unable to go on walks anymore. The hip will hurt at night. The more activity he does during the day, the worse it hurts at night. He is not having any back pain or lower extremity weakness or paresthesia with this. The left hip will also hurt on occasion but no where near as bad as the right. The hip is now essentially taking over his life as to what he can or can not do. He is ready to get the hip replaced at this time. He discussed doing an anterior approach. We did discuss the intricacies of this and he wants to go ahead and proceed with a total hip arthroplasty via anterior approach. He is an excellent candidate for that based on his lack of significant deformity and based on body habitus. They have been treated conservatively in the past for the above stated problem and despite conservative measures, they continue to have progressive pain and severe functional limitations and dysfunction. They have failed non-operative management including home exercise,  medications, and injections. It is felt that they would benefit from undergoing total joint replacement. Risks and benefits of the procedure have been discussed with the patient and they elect to proceed with surgery. There are no active contraindications to surgery such as ongoing infection or rapidly progressive neurological disease.   Problem List Osteoarthritis, Hip (715.35)   Allergies Penicillins. fevers, "bad dreams"   Family History Rheumatoid Arthritis. father Hypertension. mother and father Diabetes Mellitus. 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 greater than 5 Marital status. married Illicit drug use. no Living situation. live with spouse Pain Contract. no Tobacco use. never smoker; smoke(d) less than 1/2 pack(s) per day; uses less than half 1/2 can(s) smokeless per week Previously in rehab. no Tobacco / smoke exposure. no Drug/Alcohol Rehab (Currently). no Exercise. Exercises daily; does running / walking, individual sport and gym / weights Current work status. working full time Alcohol use. current drinker; drinks beer, wine and hard liquor; 8-14 per week Children. 2 Post-Surgical Plans. Plan is for home.   Medication History Fenofibrate Micronized (200MG Capsule, Oral) Active. Metoprolol Succinate (50MG Tablet ER 24HR, Oral) Active. Lisinopril (10MG Tablet, Oral) Active.   Past Surgical History No pertinent past surgical history   Medical History High blood pressure Diet-Controlled Diabetes Mellitus. Borderline   Review of Systems General:Not Present- Chills, Fever, Night Sweats, Fatigue, Weight Gain, Weight Loss and Memory Loss. Skin:Not Present- Hives, Itching,   Rash, Eczema and Lesions. HEENT:Not Present- Tinnitus, Headache, Double Vision, Visual Loss, Hearing Loss and Dentures. Respiratory:Not  Present- Shortness of breath with exertion, Shortness of breath at rest, Allergies, Coughing up blood and Chronic Cough. Cardiovascular:Not Present- Chest Pain, Racing/skipping heartbeats, Difficulty Breathing Lying Down, Murmur, Swelling and Palpitations. Gastrointestinal:Not Present- Bloody Stool, Heartburn, Abdominal Pain, Vomiting, Nausea, Constipation, Diarrhea, Difficulty Swallowing, Jaundice and Loss of appetitie. Male Genitourinary:Not Present- Urinary frequency, Blood in Urine, Weak urinary stream, Discharge, Flank Pain, Incontinence, Painful Urination, Urgency, Urinary Retention and Urinating at Night. Musculoskeletal:Present- Joint Pain. Not Present- Muscle Weakness, Muscle Pain, Joint Swelling, Back Pain, Morning Stiffness and Spasms. Neurological:Not Present- Tremor, Dizziness, Blackout spells, Paralysis, Difficulty with balance and Weakness. Psychiatric:Not Present- Insomnia.   Vitals Pulse: 76 (Regular) Resp.: 16 (Unlabored) BP: 132/70 (Sitting, Left Arm, Standard)    Physical Exam The physical exam findings are as follows:   General Mental Status - Alert, cooperative and good historian. General Appearance- pleasant. Not in acute distress. Orientation- Oriented X3. Build & Nutrition- Well nourished and Well developed.   Head and Neck Head- normocephalic, atraumatic . Neck Global Assessment- supple. no bruit auscultated on the right and no bruit auscultated on the left.   Eye Pupil- Bilateral- Regular and Round. Motion- Bilateral- EOMI.   Chest and Lung Exam Auscultation: Breath sounds:- clear at anterior chest wall and - clear at posterior chest wall. Adventitious sounds:- No Adventitious sounds.   Cardiovascular Auscultation:Rhythm- Regular rate and rhythm. Heart Sounds- S1 WNL and S2 WNL. Murmurs & Other Heart Sounds:Auscultation of the heart reveals - No Murmurs.   Abdomen Palpation/Percussion:Tenderness-  Abdomen is non-tender to palpation. Rigidity (guarding)- Abdomen is soft. Auscultation:Auscultation of the abdomen reveals - Bowel sounds normal.   Male Genitourinary Not done, not pertinent to present illness  Musculoskeletal He is a well developed male. He is alert and oriented. No apparent distress. He walks with a slightly antalgic gait pattern on the right. His left hip has flexion to 95 degrees, about 5 internal rotation, 20 external rotation and 20 abduction. The right hip flexion to 90. No internal rotation and about 5 external rotation and about 15 abduction. The knee exam is normal bilaterally. Pulse, sensation and motor are intact in both lower extremities.  RADIOGRAPHS: AP of the pelvis and lateral of the right hip shows severe bone on bone arthritis of the right hip with some erosion of the femoral head. This is slightly worse than back in March. He also has significant narrowing in the left hip. This has been unchanged since March.  Assessment & Plan Osteoarthritis, Hip (715.35) Impression: Right Hip  Note: Plan is for a Right Total Hip Replacement - Anterior Approach by Dr. Aluisio.  Plan is to go home.  PCP - Dr. Wendy McNeil Cardiology - Dr. Varanasi  Signed electronically by DREW L PERKINS, PA-C 

## 2012-12-31 NOTE — Interval H&P Note (Signed)
History and Physical Interval Note:  12/31/2012 10:17 AM  Kevin Lucero  has presented today for surgery, with the diagnosis of OA OF RIGHT HIP  The various methods of treatment have been discussed with the patient and family. After consideration of risks, benefits and other options for treatment, the patient has consented to  Procedure(s): TOTAL HIP ARTHROPLASTY ANTERIOR APPROACH (Right) as a surgical intervention .  The patient's history has been reviewed, patient examined, no change in status, stable for surgery.  I have reviewed the patient's chart and labs.  Questions were answered to the patient's satisfaction.     Loanne Drilling

## 2012-12-31 NOTE — Op Note (Signed)
OPERATIVE REPORT  PREOPERATIVE DIAGNOSIS: Osteoarthritis of the Right hip.   POSTOPERATIVE DIAGNOSIS: Osteoarthritis of the Right  hip.   PROCEDURE: Right total hip arthroplasty, anterior approach.   SURGEON: Ollen Gross, MD   ASSISTANT: Avel Peace, PA-C  ANESTHESIA:  Spinal  ESTIMATED BLOOD LOSS:- 900 ml    DRAINS: Hemovac x1.   COMPLICATIONS: None   CONDITION: PACU - hemodynamically stable.   BRIEF CLINICAL NOTE: Kevin Lucero is a 57 y.o. male who has advanced end-  stage arthritis of his Right  hip with progressively worsening pain and  dysfunction.The patient has failed nonoperative management and presents for  total hip arthroplasty.   PROCEDURE IN DETAIL: After successful administration of spinal  anesthetic, the traction boots for the [facility] bed were placed on both  feet and the patient was placed onto the [facility] bed, boots placed into the leg  holders. The Right hip was then isolated from the perineum with plastic  drapes and prepped and draped in the usual sterile fashion. ASIS and  greater trochanter were marked and a oblique incision was made, starting  at about 1 cm lateral and 2 cm distal to the ASIS and coursing towards  the anterior cortex of the femur. The skin was cut with a 10 blade  through subcutaneous tissue to the level of the fascia overlying the  tensor fascia lata muscle. The fascia was then incised in line with the  incision at the junction of the anterior third and posterior 2/3rd. The  muscle was teased off the fascia and then the interval between the TFL  and the rectus was developed. The Hohmann retractor was then placed at  the top of the femoral neck over the capsule. The vessels overlying the  capsule were cauterized and the fat on top of the capsule was removed.  A Hohmann retractor was then placed anterior underneath the rectus  femoris to give exposure to the entire anterior capsule. A T-shaped  capsulotomy was performed.  The edges were tagged and the femoral head  was identified.       Osteophytes are removed off the superior acetabulum.  The femoral neck was then cut in situ with an oscillating saw. Traction  was then applied to the left lower extremity utilizing the [facility]  traction. The femoral head was then removed. Retractors were placed  around the acetabulum and then circumferential removal of the labrum was  performed. Osteophytes were also removed. Reaming starts at 47 mm to  medialize and  Increased in 2 mm increments to 53 mm. We reamed in  approximately 40 degrees of abduction, 20 degrees anteversion. A 54 mm  pinnacle acetabular shell was then impacted in anatomic position under  fluoroscopic guidance with excellent purchase. We placed 2   additional dome screws. A 36 mm neutral + 4 marathon liner was then  placed into the acetabular shell.       The femoral lift was then placed along the lateral aspect of the femur  just distal to the vastus ridge. The leg was  externally rotated and capsule  was stripped off the inferior aspect of the femoral neck down to the  level of the lesser trochanter, this was done with electrocautery. The femur was lifted after this was performed. The  leg was then placed and extended in adducted position to essentially delivering the femur. We also removed the capsule superiorly and the  piriformis from the piriformis fossa  to gain excellent exposure of the  proximal femur. Rongeur was used to remove some cancellous bone to get  into the lateral portion of the proximal femur for placement of the  initial starter reamer. The starter broaches was placed  the starter broach  and was shown to go down the center of the canal. Broaching  with the  Corail system was then performed starting at size 8, coursing  Up to size 12. A size 12 had excellent torsional and rotational  and axial stability. The trial standard offset neck was then placed  with a 36 + 1.5 trial head. The  hip was then reduced. We confirmed that  the stem was in the canal both on AP and lateral x-rays. It also has excellent sizing. The hip was reduced with outstanding stability through full extension, full external rotation,  and then flexion in adduction internal rotation. AP pelvis was taken  and the leg lengths were measured and found to be exactly equal. Hip  was then dislocated again and the femoral head and neck removed. The  femoral broach was removed. Size 12 Corail stem with a standard offset  neck was then impacted into the femur following native anteversion. Has  excellent purchase in the canal. Excellent torsional and rotational and  axial stability. It is confirmed to be in the canal on AP and lateral  fluoroscopic views. The 36+ 1.5 ceramic head was placed and the hip  reduced with outstanding stability. Again AP pelvis was taken and it  confirmed that the leg lengths were equal. The wound was then copiously  irrigated with saline solution and the capsule reattached and repaired  with Ethibond suture.  20 mL of Exparel mixed with 50 mL of saline injected  into the capsule and into the edge of the tensor fascia lata as well as  subcutaneous tissue. The fascia overlying the tensor fascia lata was  then closed with a running #1 V-Loc. Subcu was closed with interrupted  2-0 Vicryl and subcuticular running 4-0 Monocryl. Incision was cleaned  and dried. Steri-Strips and a bulky sterile dressing applied. Hemovac  drain was hooked to suction and then he was awakened and transported to  recovery in stable condition.        Please note that a surgical assistant was a medical necessity for this procedure to perform it in a safe and expeditious manner. Assistant was necessary to provide appropriate retraction of vital neurovascular structures and to prevent femoral fracture and allow for anatomic placement of the prosthesis.  Ollen Gross, M.D.

## 2012-12-31 NOTE — Progress Notes (Signed)
Utilization review completed.  

## 2012-12-31 NOTE — Anesthesia Procedure Notes (Signed)
Spinal  Patient location during procedure: OR Staffing Anesthesiologist: CARIGNAN, PETER Performed by: anesthesiologist  Preanesthetic Checklist Completed: patient identified, site marked, surgical consent, pre-op evaluation, timeout performed, IV checked, risks and benefits discussed and monitors and equipment checked Spinal Block Patient position: sitting Prep: Betadine Patient monitoring: heart rate, continuous pulse ox and blood pressure Approach: right paramedian Location: L2-3 Injection technique: single-shot Needle Needle type: Spinocan  Needle gauge: 22 G Needle length: 9 cm Additional Notes Expiration date of kit checked and confirmed. Patient tolerated procedure well, without complications.     

## 2012-12-31 NOTE — Anesthesia Postprocedure Evaluation (Signed)
  Anesthesia Post-op Note  Patient: Kevin Lucero  Procedure(s) Performed: Procedure(s) (LRB): TOTAL HIP ARTHROPLASTY ANTERIOR APPROACH (Right)  Patient Location: PACU  Anesthesia Type: Spinal  Level of Consciousness: awake and alert   Airway and Oxygen Therapy: Patient Spontanous Breathing  Post-op Pain: mild  Post-op Assessment: Post-op Vital signs reviewed, Patient's Cardiovascular Status Stable, Respiratory Function Stable, Patent Airway and No signs of Nausea or vomiting  Last Vitals:  Filed Vitals:   12/31/12 1345  BP: 110/83  Pulse: 83  Temp: 36.2 C  Resp: 21    Post-op Vital Signs: stable   Complications: No apparent anesthesia complications

## 2013-01-01 ENCOUNTER — Encounter (HOSPITAL_COMMUNITY): Payer: Self-pay | Admitting: Orthopedic Surgery

## 2013-01-01 LAB — BASIC METABOLIC PANEL
BUN: 11 mg/dL (ref 6–23)
CO2: 28 mEq/L (ref 19–32)
Calcium: 8.1 mg/dL — ABNORMAL LOW (ref 8.4–10.5)
Chloride: 103 mEq/L (ref 96–112)
Creatinine, Ser: 1.11 mg/dL (ref 0.50–1.35)
GFR calc Af Amer: 84 mL/min — ABNORMAL LOW (ref >90)
GFR calc non Af Amer: 72 mL/min — ABNORMAL LOW (ref >90)
Glucose, Bld: 114 mg/dL — ABNORMAL HIGH (ref 70–99)
Potassium: 4.5 mEq/L (ref 3.5–5.1)
Sodium: 137 mEq/L (ref 135–145)

## 2013-01-01 LAB — CBC
HCT: 32.6 % — ABNORMAL LOW (ref 39.0–52.0)
Hemoglobin: 11.2 g/dL — ABNORMAL LOW (ref 13.0–17.0)
MCH: 31.5 pg (ref 26.0–34.0)
MCHC: 34.4 g/dL (ref 30.0–36.0)
MCV: 91.8 fL (ref 78.0–100.0)
Platelets: 235 10*3/uL (ref 150–400)
RBC: 3.55 MIL/uL — ABNORMAL LOW (ref 4.22–5.81)
RDW: 12.9 % (ref 11.5–15.5)
WBC: 8.5 10*3/uL (ref 4.0–10.5)

## 2013-01-01 NOTE — Care Management (Signed)
[facility] is following this pt.  Thank You Venia Minks

## 2013-01-01 NOTE — Evaluation (Signed)
Physical Therapy Evaluation Patient Details Name: Kevin Lucero MRN: [id number] DOB: [birth year] Today's Date: 01/01/2013 Time: 2952-8413 PT Time Calculation (min): 18 min  PT Assessment / Plan / Recommendation Clinical Impression  57 yo male s/p R THA-direct anterior. Had some issues with questionable hip subluxation, on yesterday, however pt was re-evaluated by Dr Despina Hick and given the okay to proceed with therapy today. On eval pt required Min assist for mobility. Ambulation distance limited by nausea, lightheadedness. Recommend HHPT, [place].     PT Assessment  Patient needs continued PT services    Follow Up Recommendations  Home health PT    Does the patient have the potential to tolerate intense rehabilitation      Barriers to Discharge        Equipment Recommendations  Rolling walker with 5" wheels    Recommendations for Other Services OT consult   Frequency 7X/week    Precautions / Restrictions Precautions Precautions: Fall Restrictions Weight Bearing Restrictions: No RLE Weight Bearing: Weight bearing as tolerated   Pertinent Vitals/Pain 7/10 R hip      Mobility  Bed Mobility Bed Mobility: Supine to Sit Supine to Sit: 4: Min assist;HOB elevated;With rails Details for Bed Mobility Assistance: Assist for R LE off bed. Increased time. VCs safety, technique.  Transfers Transfers: Sit to Stand;Stand to Sit Sit to Stand: From bed;With upper extremity assist Stand to Sit: To chair/3-in-1;With armrests;With upper extremity assist Details for Transfer Assistance: VCs safety, technique, hand placement. Assist to rise, stabilize, control descent Ambulation/Gait Ambulation/Gait Assistance: 4: Min assist Ambulation Distance (Feet): 25 Feet Assistive device: Rolling walker Ambulation/Gait Assistance Details: VCs safety, technique, sequence. Slow gait speed. Pt c/o nausea, "feeling hot and sweaty" .Recliner brought out of room for pt to sit down.  Gait Pattern:  Antalgic;Step-to pattern;Decreased stride length;Decreased step length - right    Exercises     PT Diagnosis: Difficulty walking;Abnormality of gait;Acute pain  PT Problem List: Decreased strength;Decreased activity tolerance;Decreased mobility;Pain;Decreased knowledge of use of DME;Decreased range of motion PT Treatment Interventions: DME instruction;Gait training;Functional mobility training;Therapeutic activities;Therapeutic exercise;Stair training;Patient/family education   PT Goals Acute Rehab PT Goals PT Goal Formulation: With patient Time For Goal Achievement: 01/08/13 Potential to Achieve Goals: Good Pt will go Supine/Side to Sit: with supervision PT Goal: Supine/Side to Sit - Progress: Goal set today Pt will go Sit to Supine/Side: with supervision PT Goal: Sit to Supine/Side - Progress: Goal set today Pt will go Sit to Stand: with supervision PT Goal: Sit to Stand - Progress: Goal set today Pt will Ambulate: 51 - 150 feet;with supervision;with rolling walker PT Goal: Ambulate - Progress: Goal set today Pt will Go Up / Down Stairs: 3-5 stairs;with least restrictive assistive device;with rail(s);with min assist PT Goal: Up/Down Stairs - Progress: Goal set today Pt will Perform Home Exercise Program: with supervision, verbal cues required/provided PT Goal: Perform Home Exercise Program - Progress: Goal set today  Visit Information  Last PT Received On: 01/01/13 Assistance Needed: +1    Subjective Data  Subjective: Im nervous Patient Stated Goal: to do well. home   Prior Functioning  Home Living Lives With: Spouse Available Help at Discharge: Family Type of Home: House Home Access: Stairs to enter Secretary/administrator of Steps: 2 Entrance Stairs-Rails: Right;Left Home Layout: Two level;Bed/bath upstairs Alternate Level Stairs-Number of Steps: 1 flight Alternate Level Stairs-Rails: Left Bathroom Shower/Tub: Walk-in shower (1/2 bath downstairs) Bathroom Toilet:  Standard Home Adaptive Equipment: None Prior Function Level of Independence: Independent Able to Take  Stairs?: Yes Driving: Yes Communication Communication: No difficulties    Cognition  Cognition Overall Cognitive Status: Appears within functional limits for tasks assessed/performed Arousal/Alertness: Awake/alert Orientation Level: Appears intact for tasks assessed Behavior During Session: [facility] for tasks performed    Extremity/Trunk Assessment Right Upper Extremity Assessment RUE ROM/Strength/Tone: Within functional levels Left Upper Extremity Assessment LUE ROM/Strength/Tone: Within functional levels Right Lower Extremity Assessment RLE ROM/Strength/Tone: Deficits RLE ROM/Strength/Tone Deficits: hip flex 2/5, hip abd/add 2/5, moves ankle well Left Lower Extremity Assessment LLE ROM/Strength/Tone: WFL for tasks assessed   Balance    End of Session PT - End of Session Equipment Utilized During Treatment: Gait belt Activity Tolerance: Patient limited by pain (Limited by nausea, lightheadedness) Patient left: in chair;with call bell/phone within reach;with family/visitor present  GP     Rebeca Alert, MPT Pager: [phone]

## 2013-01-01 NOTE — Progress Notes (Signed)
Physical Therapy Treatment Patient Details Name: Kevin Lucero MRN: [id number] DOB: 1956-09-20 Today's Date: 01/01/2013 Time: 7829-5621 PT Time Calculation (min): 25 min  PT Assessment / Plan / Recommendation Comments on Treatment Session  Progressing well with mobility. Recommend HHPT. Pt c/o posterior R hip pain-recommended pt inform MD of this in am, just in case. However, pt tolerated therapy well.     Follow Up Recommendations  Home health PT     Does the patient have the potential to tolerate intense rehabilitation     Barriers to Discharge        Equipment Recommendations  Rolling walker with 5" wheels    Recommendations for Other Services OT consult  Frequency 7X/week   Plan Discharge plan remains appropriate    Precautions / Restrictions Precautions Precautions: None Restrictions Weight Bearing Restrictions: No RLE Weight Bearing: Weight bearing as tolerated   Pertinent Vitals/Pain 5/10 R hip    Mobility  Bed Mobility Bed Mobility: Supine to Sit;Sit to Supine Supine to Sit: 4: Min assist Sit to Supine: 4: Min assist Details for Bed Mobility Assistance: Assist for R LE onto/off bed.  Transfers Transfers: Sit to Stand;Stand to Sit Sit to Stand: 4: Min guard;From bed Stand to Sit: 4: Min guard;To bed Stand Pivot Transfers: 4: Min guard Details for Transfer Assistance: VCs safety, technique, hand placement.  Ambulation/Gait Ambulation Distance (Feet): 90 Feet Assistive device: Rolling walker Ambulation/Gait Assistance Details: VCS safety, step length.  Gait Pattern: Step-to pattern;Step-through pattern;Decreased stride length;Decreased step length - right    Exercises Total Joint Exercises Ankle Circles/Pumps: AROM;Both;10 reps;Supine Quad Sets: AROM;Both;10 reps;Supine Short Arc Quad: AROM;Right;10 reps;Supine Heel Slides: AAROM;Right;10 reps;Supine Hip ABduction/ADduction: AAROM;Right;10 reps;Supine   PT Diagnosis:    PT Problem List:   PT  Treatment Interventions:     PT Goals Acute Rehab PT Goals Pt will go Supine/Side to Sit: with supervision PT Goal: Supine/Side to Sit - Progress: Progressing toward goal Pt will go Sit to Supine/Side: with supervision PT Goal: Sit to Supine/Side - Progress: Progressing toward goal Pt will go Sit to Stand: with supervision PT Goal: Sit to Stand - Progress: Progressing toward goal Pt will Ambulate: 51 - 150 feet;with supervision;with rolling walker PT Goal: Ambulate - Progress: Progressing toward goal Pt will Perform Home Exercise Program: with supervision, verbal cues required/provided PT Goal: Perform Home Exercise Program - Progress: Progressing toward goal  Visit Information  Last PT Received On: 01/01/13 Assistance Needed: +1    Subjective Data  Subjective: I think that went better this time. Patient Stated Goal: to do well. home   Cognition       Balance     End of Session PT - End of Session Equipment Utilized During Treatment: Gait belt Activity Tolerance: Patient tolerated treatment well Patient left: in bed   GP     Rebeca Alert, MPT Pager: [phone]

## 2013-01-01 NOTE — Evaluation (Signed)
Occupational Therapy Evaluation Patient Details Name: Kevin Lucero MRN: [id number] DOB: [birth year] Today's Date: 01/01/2013 Time: 7846-9629 OT Time Calculation (min): 17 min  OT Assessment / Plan / Recommendation Clinical Impression  This 57 year old man was admitted for R anterior direct THA.  Pt is appropriate for OT for continued education for adls and bathroom transfers.  He will not need post acute OT services.      OT Assessment  Patient needs continued OT Services    Follow Up Recommendations  No OT follow up    Barriers to Discharge      Equipment Recommendations  3 in 1 bedside comode    Recommendations for Other Services    Frequency  Min 2X/week    Precautions / Restrictions Precautions Precautions: None Restrictions Weight Bearing Restrictions: No   Pertinent Vitals/Pain 6-7 anterior aspect R thigh.  Sats 97% RA, HR 80s    ADL  Grooming: Set up Where Assessed - Grooming: Unsupported sitting Upper Body Bathing: Set up Where Assessed - Upper Body Bathing: Unsupported sitting Lower Body Bathing: Moderate assistance Where Assessed - Lower Body Bathing: Supported sit to stand Upper Body Dressing: Minimal assistance (iv) Where Assessed - Upper Body Dressing: Unsupported sitting Lower Body Dressing: Maximal assistance Where Assessed - Lower Body Dressing: Unsupported sit to stand Toilet Transfer: Simulated;Minimal assistance Toilet Transfer Method: Sit to stand Toilet Transfer Equipment:  (bed to chair after ambulating) Toileting - Clothing Manipulation and Hygiene: Minimal assistance Where Assessed - Toileting Clothing Manipulation and Hygiene: Sit to stand from 3-in-1 or toilet Equipment Used: Rolling walker Transfers/Ambulation Related to ADLs: ambulated to hall.  Pt got hot and nauseas initially, which passed.  Chair brought to him and cold cloth.  VSS ADL Comments: Pt had difficulty with adls prior to sx.  L hip also bad.  Will plan to show AE next  visit.      OT Diagnosis: Generalized weakness  OT Problem List: Decreased activity tolerance;Pain;Decreased knowledge of use of DME or AE OT Treatment Interventions: Self-care/ADL training;DME and/or AE instruction;Patient/family education   OT Goals Acute Rehab OT Goals OT Goal Formulation: With patient Time For Goal Achievement: 01/08/13 Potential to Achieve Goals: Good ADL Goals Pt Will Perform Lower Body Bathing: with supervision;Sit to stand from chair;with adaptive equipment ADL Goal: Lower Body Bathing - Progress: Goal set today Pt Will Perform Lower Body Dressing: with supervision;Sit to stand from chair;with adaptive equipment ADL Goal: Lower Body Dressing - Progress: Goal set today Pt Will Transfer to Toilet: with supervision;Ambulation;3-in-1 ADL Goal: Toilet Transfer - Progress: Goal set today Pt Will Perform Tub/Shower Transfer: Shower transfer;Ambulation ADL Goal: Tub/Shower Transfer - Progress: Goal set today  Visit Information  Last OT Received On: 01/01/13 Assistance Needed: +1 PT/OT Co-Evaluation/Treatment: Yes    Subjective Data  Subjective: It just hurts here (top of R thigh) about 6 to 7 Patient Stated Goal: none stated.  Agreeable to OT/PT   Prior Functioning     Home Living Lives With: Spouse Available Help at Discharge: Family Type of Home: House Home Access: Stairs to enter Secretary/administrator of Steps: 2 Entrance Stairs-Rails: Right;Left Home Layout: Two level;Bed/bath upstairs Alternate Level Stairs-Number of Steps: 1 flight Alternate Level Stairs-Rails: Left Bathroom Shower/Tub: Walk-in shower (1/2 bath downstairs) Bathroom Toilet: Standard Home Adaptive Equipment: None Prior Function Level of Independence: Independent Able to Take Stairs?: Yes Driving: Yes Communication Communication: No difficulties         Vision/Perception     Copywriter, advertising  Overall Cognitive Status: Appears within functional limits for tasks  assessed/performed Arousal/Alertness: Awake/alert Orientation Level: Appears intact for tasks assessed Behavior During Session: [facility] for tasks performed    Extremity/Trunk Assessment Right Upper Extremity Assessment RUE ROM/Strength/Tone: Within functional levels Left Upper Extremity Assessment LUE ROM/Strength/Tone: Within functional levels     Mobility Bed Mobility Bed Mobility: Supine to Sit Supine to Sit: 4: Min assist;With rails;HOB elevated Transfers Transfers: Sit to Stand Sit to Stand: 4: Min assist Details for Transfer Assistance: cues for hand/leg placement     Exercise     Balance     End of Session OT - End of Session Activity Tolerance: Patient tolerated treatment well Patient left: in chair;with call bell/phone within reach  GO     SPENCER,MARYELLEN 01/01/2013, 10:27 AM Marica Otter, OTR/L [phone] 01/01/2013

## 2013-01-01 NOTE — Progress Notes (Signed)
Physical Therapy Treatment Patient Details Name: Kevin Lucero MRN: [id number] DOB: May 07, 1956 Today's Date: 01/01/2013 Time: 1201-1209 PT Time Calculation (min): 8 min  PT Assessment / Plan / Recommendation Comments on Treatment Session  Pt requesting to get back in bed to rest. Will plan to see for one more session later today.     Follow Up Recommendations  Home health PT     Does the patient have the potential to tolerate intense rehabilitation     Barriers to Discharge        Equipment Recommendations  Rolling walker with 5" wheels    Recommendations for Other Services OT consult  Frequency 7X/week   Plan Discharge plan remains appropriate    Precautions / Restrictions Precautions Precautions: Fall Restrictions Weight Bearing Restrictions: No RLE Weight Bearing: Weight bearing as tolerated   Pertinent Vitals/Pain 5/10 R hip    Mobility  Bed Mobility Bed Mobility: Sit to Supine Sit to Supine: 4: Min assist Details for Bed Mobility Assistance: Assist for R LE onto bed.  Transfers Transfers: Sit to Stand;Stand to [employer] Transfers Sit to Stand: 4: Min guard;From chair/3-in-1 Stand to Sit: 4: Min guard;To bed Stand Pivot Transfers: 4: Min guard Details for Transfer Assistance: VCs safety, technique, hand placement. Stand pivot recliner>bed. Ambulation/Gait Ambulation/Gait Assistance: 4: Min assist Ambulation Distance (Feet): 25 Feet Assistive device: Rolling walker Ambulation/Gait Assistance Details: VCs safety, technique, sequence. Slow gait speed. Pt c/o nausea, "feeling hot and sweaty" .Recliner brought out of room for pt to sit down.  Gait Pattern: Antalgic;Step-to pattern;Decreased stride length;Decreased step length - right    Exercises     PT Diagnosis: Difficulty walking;Abnormality of gait;Acute pain  PT Problem List: Decreased strength;Decreased activity tolerance;Decreased mobility;Pain;Decreased knowledge of use of DME;Decreased range of  motion PT Treatment Interventions: DME instruction;Gait training;Functional mobility training;Therapeutic activities;Therapeutic exercise;Stair training;Patient/family education   PT Goals Acute Rehab PT Goals PT Goal Formulation: With patient Time For Goal Achievement: 01/08/13 Potential to Achieve Goals: Good Pt will go Supine/Side to Sit: with supervision PT Goal: Supine/Side to Sit - Progress: Goal set today Pt will go Sit to Supine/Side: with supervision PT Goal: Sit to Supine/Side - Progress: Progressing toward goal Pt will go Sit to Stand: with supervision PT Goal: Sit to Stand - Progress: Progressing toward goal Pt will Ambulate: 51 - 150 feet;with supervision;with rolling walker PT Goal: Ambulate - Progress: Goal set today Pt will Go Up / Down Stairs: 3-5 stairs;with least restrictive assistive device;with rail(s);with min assist PT Goal: Up/Down Stairs - Progress: Goal set today Pt will Perform Home Exercise Program: with supervision, verbal cues required/provided PT Goal: Perform Home Exercise Program - Progress: Goal set today  Visit Information  Last PT Received On: 01/01/13 Assistance Needed: +1    Subjective Data  Subjective: I wanna take a nap before my 2nd sesson Patient Stated Goal: to do well. home   Cognition  Cognition Overall Cognitive Status: Appears within functional limits for tasks assessed/performed Arousal/Alertness: Awake/alert Orientation Level: Appears intact for tasks assessed Behavior During Session: [facility] for tasks performed    Balance     End of Session PT - End of Session Equipment Utilized During Treatment: Gait belt Activity Tolerance: Patient tolerated treatment well Patient left: in bed;with call bell/phone within reach;with family/visitor present   GP     Rebeca Alert, MPT Pager: [phone]

## 2013-01-01 NOTE — Progress Notes (Signed)
   Subjective: 1 Day Post-Op Procedure(s) (LRB): TOTAL HIP ARTHROPLASTY ANTERIOR APPROACH (Right) Patient reports pain as mild.   Patient seen in rounds by Dr. Lequita Halt. Patient is well, and has had no acute complaints or problems only some soreness in the thigh. We will start therapy today.  Plan is to go Home after hospital stay.  Objective: Vital signs in last 24 hours: Temp:  [97 F (36.1 C)-98.5 F (36.9 C)] 97.9 F (36.6 C) (03/13 0553) Pulse Rate:  [60-83] 68 (03/13 0553) Resp:  [12-21] 18 (03/13 0800) BP: (94-138)/(49-83) 96/57 mmHg (03/13 0553) SpO2:  [94 %-100 %] 96 % (03/13 0800) Weight:  [103.42 kg (228 lb)] 103.42 kg (228 lb) (03/12 1530)  Intake/Output from previous day:  Intake/Output Summary (Last 24 hours) at 01/01/13 0935 Last data filed at 01/01/13 0600  Gross per 24 hour  Intake   6750 ml  Output   4270 ml  Net   2480 ml    Intake/Output this shift: UOP 1975 since MN - Diuresing well +2480  Labs:  Recent Labs  01/01/13 0441  HGB 11.2*    Recent Labs  01/01/13 0441  WBC 8.5  RBC 3.55*  HCT 32.6*  PLT 235    Recent Labs  01/01/13 0441  NA 137  K 4.5  CL 103  CO2 28  BUN 11  CREATININE 1.11  GLUCOSE 114*  CALCIUM 8.1*   No results found for this basename: LABPT, INR,  in the last 72 hours  EXAM General - Patient is Alert, Appropriate and Oriented Extremity - Neurovascular intact Sensation intact distally No cellulitis present Dressing - dressing C/D/I Motor Function - intact, moving foot and toes well on exam.  Hemovac pulled without difficulty.  Past Medical History  Diagnosis Date  . Hypertension   . Diabetes mellitus without complication     DIET CONTROL  . GERD (gastroesophageal reflux disease)     OCCAS -- WOULD USE OTC MED IF NEEDED  . Arthritis     OA BOTH HIPS - PAIN IN RT HIP WORSE    Assessment/Plan: 1 Day Post-Op Procedure(s) (LRB): TOTAL HIP ARTHROPLASTY ANTERIOR APPROACH (Right) Principal Problem:  OA (osteoarthritis) of hip  Estimated body mass index is 33.65 kg/(m^2) as calculated from the following:   Height as of this encounter: 5\' 9"  (1.753 m).   Weight as of this encounter: 103.42 kg (228 lb). Advance diet Up with therapy Plan for discharge tomorrow Discharge home with home health  DVT Prophylaxis - Xarelto Weight Bearing As Tolerated right Leg Hemovac Pulled Begin Therapy No vaccines.  PERKINS, ALEXZANDREW 01/01/2013, 9:35 AM

## 2013-01-02 LAB — BASIC METABOLIC PANEL
BUN: 14 mg/dL (ref 6–23)
CO2: 27 mEq/L (ref 19–32)
Calcium: 8.9 mg/dL (ref 8.4–10.5)
Chloride: 102 mEq/L (ref 96–112)
Creatinine, Ser: 1.13 mg/dL (ref 0.50–1.35)
GFR calc Af Amer: 82 mL/min — ABNORMAL LOW (ref >90)
GFR calc non Af Amer: 71 mL/min — ABNORMAL LOW (ref >90)
Glucose, Bld: 163 mg/dL — ABNORMAL HIGH (ref 70–99)
Potassium: 4 mEq/L (ref 3.5–5.1)
Sodium: 136 mEq/L (ref 135–145)

## 2013-01-02 LAB — CBC
HCT: 33.1 % — ABNORMAL LOW (ref 39.0–52.0)
Hemoglobin: 11.4 g/dL — ABNORMAL LOW (ref 13.0–17.0)
MCH: 31.7 pg (ref 26.0–34.0)
MCHC: 34.4 g/dL (ref 30.0–36.0)
MCV: 91.9 fL (ref 78.0–100.0)
Platelets: 271 10*3/uL (ref 150–400)
RBC: 3.6 MIL/uL — ABNORMAL LOW (ref 4.22–5.81)
RDW: 12.6 % (ref 11.5–15.5)
WBC: 16.2 10*3/uL — ABNORMAL HIGH (ref 4.0–10.5)

## 2013-01-02 MED ORDER — OXYCODONE HCL 5 MG PO TABS: 5.0000 mg | ORAL_TABLET | ORAL | Status: DC | PRN

## 2013-01-02 MED ORDER — TRAMADOL HCL 50 MG PO TABS: 50.0000 mg | ORAL_TABLET | Freq: Four times a day (QID) | ORAL | Status: DC | PRN

## 2013-01-02 MED ORDER — RIVAROXABAN 10 MG PO TABS: 10.0000 mg | ORAL_TABLET | Freq: Every day | ORAL | Status: DC

## 2013-01-02 MED ORDER — METHOCARBAMOL 500 MG PO TABS: 500.0000 mg | ORAL_TABLET | Freq: Four times a day (QID) | ORAL | Status: DC | PRN

## 2013-01-02 NOTE — Progress Notes (Signed)
Discharge summary sent to payer through [facility]  

## 2013-01-02 NOTE — Care Management Note (Signed)
    Page 1 of 2   01/02/2013     11:23:54 AM   CARE MANAGEMENT NOTE 01/02/2013  Patient:  YOANDRI, CONGROVE   Account Number:  [id number]  Date Initiated:  01/02/2013  Documentation initiated by:  Colleen Can  Subjective/Objective Assessment:   DX osteoarthritis right hip; total hip replacemnt-anterior approach     Action/Plan:   Pt plans to return home with spouse as caregiver. Plans to use [facility] for [facility] services.   Anticipated DC Date:  01/02/2013   Anticipated DC Plan:  HOME W HOME HEALTH SERVICES      DC Planning Services  CM consult      PAC Choice  DURABLE MEDICAL EQUIPMENT  HOME HEALTH   Choice offered to / List presented to:  C-1 Patient   DME arranged  3-N-1  Levan Hurst      DME agency  [facility]     HH arranged  HH-2 PT      [facility] agency  [facility]   Status of service:  Completed, signed off Medicare Important Message given?   (If response is "NO", the following Medicare IM given date fields will be blank) Date Medicare IM given:   Date Additional Medicare IM given:    Discharge Disposition:  HOME W HOME HEALTH SERVICES  Per UR Regulation:    If discussed at Long Length of Stay Meetings, dates discussed:    Comments:  01/02/2013 Darliss Cheney BSN RN CCM [phone] Pt for discharge today with [facility] services to start tomorrow 01/03/2013.

## 2013-01-02 NOTE — Progress Notes (Signed)
Physical Therapy Treatment Patient Details Name: Kevin Lucero MRN: [id number] DOB: Apr 09, 1956 Today's Date: 01/02/2013 Time: 4098-1191 PT Time Calculation (min): 50 min  PT Assessment / Plan / Recommendation Comments on Treatment Session  Pt to d/c home today. Practiced/reviewed ambulation, exercises, stair negotiation. Wife present. No further questions/concerns from pt/wife. Recommend HHPT. Ready for d/c from PT standpoint.     Follow Up Recommendations  Home health PT     Does the patient have the potential to tolerate intense rehabilitation     Barriers to Discharge        Equipment Recommendations  Rolling walker with 5" wheels    Recommendations for Other Services OT consult  Frequency 7X/week   Plan Discharge plan remains appropriate    Precautions / Restrictions Precautions Precautions: None Restrictions Weight Bearing Restrictions: No RLE Weight Bearing: Weight bearing as tolerated   Pertinent Vitals/Pain 5/10 R hip    Mobility  Bed Mobility Bed Mobility: Supine to Sit;Sit to Supine Supine to Sit: 4: Min assist Sit to Supine: 4: Min guard Details for Bed Mobility Assistance: Assist for R Le off bed.  Transfers Transfers: Sit to Stand;Stand to Sit Sit to Stand: 5: Supervision;From bed;From chair/3-in-1;With armrests;With upper extremity assist Stand to Sit: With armrests;With upper extremity assist;5: Supervision;To bed;To chair/3-in-1 Details for Transfer Assistance: VCs safety, technique, hand placement.  Ambulation/Gait Ambulation Distance (Feet): 200 Feet Assistive device: Rolling walker Gait Pattern: Step-to pattern;Step-through pattern;Antalgic;Decreased stride length Stairs: Yes Stairs Assistance: 4: Min guard Stairs Assistance Details (indicate cue type and reason): VCS safety, technique, sequence. Wife present. Practiced simulating entry into home with walker (backwards). then practiced simulating ascending steps to bedroom with 1crutch, 1 rail.   Stair Management Technique: Forwards;Backwards;With walker;With crutches;One rail Left;Step to pattern Number of Stairs: 10 (2x1, 4x2)    Exercises Total Joint Exercises Ankle Circles/Pumps: AROM;Both;20 reps;Supine Quad Sets: AROM;Both;10 reps;Supine Short Arc Quad: AROM;Right;10 reps;Supine Heel Slides: AROM;Right;10 reps;Supine Hip ABduction/ADduction: AROM;Right;10 reps;Supine   PT Diagnosis:    PT Problem List:   PT Treatment Interventions:     PT Goals Acute Rehab PT Goals Pt will go Supine/Side to Sit: with supervision PT Goal: Supine/Side to Sit - Progress: Progressing toward goal Pt will go Sit to Supine/Side: with supervision PT Goal: Sit to Supine/Side - Progress: Progressing toward goal Pt will go Sit to Stand: with supervision PT Goal: Sit to Stand - Progress: Progressing toward goal Pt will Ambulate: 51 - 150 feet;with supervision;with rolling walker PT Goal: Ambulate - Progress: Progressing toward goal Pt will Go Up / Down Stairs: 3-5 stairs;with least restrictive assistive device;with min assist PT Goal: Up/Down Stairs - Progress: Met Pt will Perform Home Exercise Program: with supervision, verbal cues required/provided PT Goal: Perform Home Exercise Program - Progress: Progressing toward goal  Visit Information  Last PT Received On: 01/02/13 Assistance Needed: +1    Subjective Data  Subjective: Im ready Patient Stated Goal: to do well. home   Cognition  Cognition Overall Cognitive Status: Appears within functional limits for tasks assessed/performed Arousal/Alertness: Awake/alert Orientation Level: Appears intact for tasks assessed Behavior During Session: [facility] for tasks performed    Balance     End of Session PT - End of Session Equipment Utilized During Treatment: Gait belt Activity Tolerance: Patient tolerated treatment well Patient left: in bed;with call bell/phone within reach;with family/visitor present   GP     Rebeca Alert, MPT Pager:  [phone]

## 2013-01-02 NOTE — Progress Notes (Signed)
   Subjective: 2 Days Post-Op Procedure(s) (LRB): TOTAL HIP ARTHROPLASTY ANTERIOR APPROACH (Right) Patient reports pain as mild.   Patient seen in rounds for Dr. Lequita Halt. Patient is well, and has had no acute complaints or problems Patient is ready to go home today.  Objective: Vital signs in last 24 hours: Temp:  [97.9 F (36.6 C)-98.7 F (37.1 C)] 97.9 F (36.6 C) (03/14 0421) Pulse Rate:  [66-84] 78 (03/14 0421) Resp:  [16-18] 16 (03/14 0421) BP: (100-126)/(61-74) 126/74 mmHg (03/14 0421) SpO2:  [93 %-99 %] 96 % (03/14 0421)  Intake/Output from previous day:  Intake/Output Summary (Last 24 hours) at 01/02/13 0658 Last data filed at 01/01/13 1802  Gross per 24 hour  Intake   1472 ml  Output   1250 ml  Net    222 ml    Intake/Output this shift:    Labs:  Recent Labs  01/01/13 0441 01/02/13 0425  HGB 11.2* 11.4*    Recent Labs  01/01/13 0441 01/02/13 0425  WBC 8.5 16.2*  RBC 3.55* 3.60*  HCT 32.6* 33.1*  PLT 235 271    Recent Labs  01/01/13 0441 01/02/13 0425  NA 137 136  K 4.5 4.0  CL 103 102  CO2 28 27  BUN 11 14  CREATININE 1.11 1.13  GLUCOSE 114* 163*  CALCIUM 8.1* 8.9   No results found for this basename: LABPT, INR,  in the last 72 hours  EXAM: General - Patient is Alert, Appropriate and Oriented Extremity - Neurovascular intact Sensation intact distally Dorsiflexion/Plantar flexion intact No cellulitis present Incision - clean, dry, no drainage, healing Motor Function - intact, moving foot and toes well on exam.   Assessment/Plan: 2 Days Post-Op Procedure(s) (LRB): TOTAL HIP ARTHROPLASTY ANTERIOR APPROACH (Right) Procedure(s) (LRB): TOTAL HIP ARTHROPLASTY ANTERIOR APPROACH (Right) Past Medical History  Diagnosis Date  . Hypertension   . Diabetes mellitus without complication     DIET CONTROL  . GERD (gastroesophageal reflux disease)     OCCAS -- WOULD USE OTC MED IF NEEDED  . Arthritis     OA BOTH HIPS - PAIN IN RT HIP  WORSE   Principal Problem:   OA (osteoarthritis) of hip  Estimated body mass index is 33.65 kg/(m^2) as calculated from the following:   Height as of this encounter: 5\' 9"  (1.753 m).   Weight as of this encounter: 103.42 kg (228 lb). Up with therapy Discharge home with home health Diet - Cardiac diet and Diabetic diet Follow up - in 2 weeks Activity - WBAT Disposition - Home Condition Upon Discharge - Good D/C Meds - See DC Summary DVT Prophylaxis - Xarelto  PERKINS, ALEXZANDREW 01/02/2013, 6:58 AM

## 2013-01-02 NOTE — Progress Notes (Signed)
Occupational Therapy Treatment Patient Details Name: Kevin Lucero MRN: [id number] DOB: August 12, 1956 Today's Date: 01/02/2013 Time: 4132-4401 OT Time Calculation (min): 20 min  OT Assessment / Plan / Recommendation Comments on Treatment Session      Follow Up Recommendations  No OT follow up    Barriers to Discharge       Equipment Recommendations  3 in 1 bedside comode    Recommendations for Other Services    Frequency     Plan      Precautions / Restrictions Precautions Precautions: None Restrictions RLE Weight Bearing: Weight bearing as tolerated   Pertinent Vitals/Pain No pain--just soreness   ADL  Lower Body Dressing: Performed;Minimal assistance (sock aide) Toilet Transfer: Buyer, retail Method: Sit to Barista:  (bed, ambulate, chair:  3:1 was delivered) Tub/Shower Transfer: Engineer, manufacturing Method: Science writer: Walk in shower ADL Comments: educated on AE, pt tried sock aid.  Shower sheet given for sequence.  Wife came at end of session    OT Diagnosis:    OT Problem List:   OT Treatment Interventions:     OT Goals ADL Goals Pt Will Perform Lower Body Bathing: with supervision;Sit to stand from chair;with adaptive equipment ADL Goal: Lower Body Bathing - Progress:  (pt verbalizes) Pt Will Perform Lower Body Dressing: with supervision;Sit to stand from chair;with adaptive equipment ADL Goal: Lower Body Dressing - Progress:  (pt verbalizes) Pt Will Transfer to Toilet: with supervision;Ambulation;3-in-1 ADL Goal: Toilet Transfer - Progress: Met Pt Will Perform Tub/Shower Transfer: Shower transfer;Ambulation ADL Goal: Web designer - Progress: Met  Visit Information  Last OT Received On: 01/02/13 Assistance Needed: +1    Subjective Data      Prior Functioning       Cognition  Cognition Overall Cognitive Status: Appears within  functional limits for tasks assessed/performed Arousal/Alertness: Awake/alert Orientation Level: Appears intact for tasks assessed Behavior During Session: [facility] for tasks performed    Mobility  Transfers Sit to Stand: 5: Supervision;From bed (cues for hand placement)    Exercises      Balance     End of Session OT - End of Session Activity Tolerance: Patient tolerated treatment well Patient left: in chair;with call bell/phone within reach  GO     [facility] 01/02/2013, 8:34 AM Marica Otter, OTR/L [phone] 01/02/2013

## 2013-01-02 NOTE — Discharge Summary (Signed)
Physician Discharge Summary   Patient ID: Kevin Lucero MRN: [id number] DOB/AGE: 02-06-1956 57 y.o.  Admit date: 12/31/2012 Discharge date: 01/02/2013  Primary Diagnosis:  Osteoarthritis of the Right hip.   Admission Diagnoses:  Past Medical History  Diagnosis Date  . Hypertension   . Diabetes mellitus without complication     DIET CONTROL  . GERD (gastroesophageal reflux disease)     OCCAS -- WOULD USE OTC MED IF NEEDED  . Arthritis     OA BOTH HIPS - PAIN IN RT HIP WORSE   Discharge Diagnoses:   Principal Problem:   OA (osteoarthritis) of hip  Estimated body mass index is 33.65 kg/(m^2) as calculated from the following:   Height as of this encounter: 5\' 9"  (1.753 m).   Weight as of this encounter: 103.42 kg (228 lb).  Procedure(s) (LRB): TOTAL HIP ARTHROPLASTY ANTERIOR APPROACH (Right)   Consults: None  HPI: Kevin Lucero is a 57 y.o. male who has advanced end-  stage arthritis of his Right hip with progressively worsening pain and  dysfunction.The patient has failed nonoperative management and presents for  total hip arthroplasty.   Laboratory Data: Admission on 12/31/2012  Component Date Value Range Status  . ABO/RH(D) 12/31/2012 A POS   Final  . Antibody Screen 12/31/2012 NEG   Final  . Sample Expiration 12/31/2012 01/03/2013   Final  . Glucose-Capillary 12/31/2012 112* 70 - 99 mg/dL Final  . Comment 1 78/29/5621 Documented in Chart   Final  . ABO/RH(D) 12/31/2012 A POS   Final  . WBC 01/01/2013 8.5  4.0 - 10.5 K/uL Final  . RBC 01/01/2013 3.55* 4.22 - 5.81 MIL/uL Final  . Hemoglobin 01/01/2013 11.2* 13.0 - 17.0 g/dL Final  . HCT 30/86/5784 32.6* 39.0 - 52.0 % Final  . MCV 01/01/2013 91.8  78.0 - 100.0 fL Final  . MCH 01/01/2013 31.5  26.0 - 34.0 pg Final  . MCHC 01/01/2013 34.4  30.0 - 36.0 g/dL Final  . RDW 69/62/9528 12.9  11.5 - 15.5 % Final  . Platelets 01/01/2013 235  150 - 400 K/uL Final  . Sodium 01/01/2013 137  135 - 145 mEq/L Final  .  Potassium 01/01/2013 4.5  3.5 - 5.1 mEq/L Final  . Chloride 01/01/2013 103  96 - 112 mEq/L Final  . CO2 01/01/2013 28  19 - 32 mEq/L Final  . Glucose, Bld 01/01/2013 114* 70 - 99 mg/dL Final  . BUN 41/32/4401 11  6 - 23 mg/dL Final  . Creatinine, Ser 01/01/2013 1.11  0.50 - 1.35 mg/dL Final  . Calcium 02/72/5366 8.1* 8.4 - 10.5 mg/dL Final  . GFR calc non Af Amer 01/01/2013 72* >90 mL/min Final  . GFR calc Af Amer 01/01/2013 84* >90 mL/min Final   Comment:                                 The eGFR has been calculated                          using the CKD EPI equation.                          This calculation has not been                          validated in  all clinical                          situations.                          eGFR's persistently                          <90 mL/min signify                          possible Chronic Kidney Disease.  . WBC 01/02/2013 16.2* 4.0 - 10.5 K/uL Final  . RBC 01/02/2013 3.60* 4.22 - 5.81 MIL/uL Final  . Hemoglobin 01/02/2013 11.4* 13.0 - 17.0 g/dL Final  . HCT 11/91/4782 33.1* 39.0 - 52.0 % Final  . MCV 01/02/2013 91.9  78.0 - 100.0 fL Final  . MCH 01/02/2013 31.7  26.0 - 34.0 pg Final  . MCHC 01/02/2013 34.4  30.0 - 36.0 g/dL Final  . RDW 95/62/1308 12.6  11.5 - 15.5 % Final  . Platelets 01/02/2013 271  150 - 400 K/uL Final  . Sodium 01/02/2013 136  135 - 145 mEq/L Final  . Potassium 01/02/2013 4.0  3.5 - 5.1 mEq/L Final  . Chloride 01/02/2013 102  96 - 112 mEq/L Final  . CO2 01/02/2013 27  19 - 32 mEq/L Final  . Glucose, Bld 01/02/2013 163* 70 - 99 mg/dL Final  . BUN 65/78/4696 14  6 - 23 mg/dL Final  . Creatinine, Ser 01/02/2013 1.13  0.50 - 1.35 mg/dL Final  . Calcium 29/52/8413 8.9  8.4 - 10.5 mg/dL Final  . GFR calc non Af Amer 01/02/2013 71* >90 mL/min Final  . GFR calc Af Amer 01/02/2013 82* >90 mL/min Final   Comment:                                 The eGFR has been calculated                          using the CKD EPI  equation.                          This calculation has not been                          validated in all clinical                          situations.                          eGFR's persistently                          <90 mL/min signify                          possible Chronic Kidney Disease.  Hospital Outpatient Visit on 12/17/2012  Component Date Value Range Status  . MRSA, PCR 12/17/2012 NEGATIVE  NEGATIVE Final  . Staphylococcus aureus 12/17/2012 POSITIVE* NEGATIVE Final   Comment:  The Xpert SA Assay (FDA                          approved for NASAL specimens                          in patients over 79 years of age),                          is one component of                          a comprehensive surveillance                          program.  Test performance has                          been validated by [employer] for patients greater                          than or equal to 55 year old.                          It is not intended                          to diagnose infection nor to                          guide or monitor treatment.  Marland Kitchen aPTT 12/17/2012 40* 24 - 37 seconds Final   Comment:                                 IF BASELINE aPTT IS ELEVATED,                          SUGGEST PATIENT RISK ASSESSMENT                          BE USED TO DETERMINE APPROPRIATE                          ANTICOAGULANT THERAPY.  . WBC 12/17/2012 6.0  4.0 - 10.5 K/uL Final  . RBC 12/17/2012 5.01  4.22 - 5.81 MIL/uL Final  . Hemoglobin 12/17/2012 15.5  13.0 - 17.0 g/dL Final  . HCT 16/07/9603 46.0  39.0 - 52.0 % Final  . MCV 12/17/2012 91.8  78.0 - 100.0 fL Final  . MCH 12/17/2012 30.9  26.0 - 34.0 pg Final  . MCHC 12/17/2012 33.7  30.0 - 36.0 g/dL Final  . RDW 54/06/8118 12.9  11.5 - 15.5 % Final  . Platelets 12/17/2012 305  150 - 400 K/uL Final  . Sodium 12/17/2012 138  135 - 145 mEq/L Final  . Potassium 12/17/2012  3.9  3.5 - 5.1 mEq/L Final  . Chloride 12/17/2012 104  96 - 112 mEq/L Final  .  CO2 12/17/2012 24  19 - 32 mEq/L Final  . Glucose, Bld 12/17/2012 105* 70 - 99 mg/dL Final  . BUN 16/07/9603 19  6 - 23 mg/dL Final  . Creatinine, Ser 12/17/2012 1.25  0.50 - 1.35 mg/dL Final  . Calcium 54/06/8118 9.8  8.4 - 10.5 mg/dL Final  . Total Protein 12/17/2012 7.8  6.0 - 8.3 g/dL Final  . Albumin 14/78/2956 4.2  3.5 - 5.2 g/dL Final  . AST 21/30/8657 38* 0 - 37 U/L Final  . ALT 12/17/2012 31  0 - 53 U/L Final  . Alkaline Phosphatase 12/17/2012 42  39 - 117 U/L Final  . Total Bilirubin 12/17/2012 0.4  0.3 - 1.2 mg/dL Final  . GFR calc non Af Amer 12/17/2012 63* >90 mL/min Final  . GFR calc Af Amer 12/17/2012 73* >90 mL/min Final   Comment:                                 The eGFR has been calculated                          using the CKD EPI equation.                          This calculation has not been                          validated in all clinical                          situations.                          eGFR's persistently                          <90 mL/min signify                          possible Chronic Kidney Disease.  Marland Kitchen Prothrombin Time 12/17/2012 12.5  11.6 - 15.2 seconds Final  . INR 12/17/2012 0.94  0.00 - 1.49 Final  . Color, Urine 12/17/2012 YELLOW  YELLOW Final  . APPearance 12/17/2012 CLEAR  CLEAR Final  . Specific Gravity, Urine 12/17/2012 1.022  1.005 - 1.030 Final  . pH 12/17/2012 6.5  5.0 - 8.0 Final  . Glucose, UA 12/17/2012 NEGATIVE  NEGATIVE mg/dL Final  . Hgb urine dipstick 12/17/2012 NEGATIVE  NEGATIVE Final  . Bilirubin Urine 12/17/2012 NEGATIVE  NEGATIVE Final  . Ketones, ur 12/17/2012 NEGATIVE  NEGATIVE mg/dL Final  . Protein, ur 84/69/6295 NEGATIVE  NEGATIVE mg/dL Final  . Urobilinogen, UA 12/17/2012 0.2  0.0 - 1.0 mg/dL Final  . Nitrite 28/41/3244 NEGATIVE  NEGATIVE Final  . Leukocytes, UA 12/17/2012 NEGATIVE  NEGATIVE Final   MICROSCOPIC NOT DONE ON  URINES WITH NEGATIVE PROTEIN, BLOOD, LEUKOCYTES, NITRITE, OR GLUCOSE <1000 mg/dL.     X-Rays:Dg Chest 2 View  12/17/2012  *RADIOLOGY REPORT*  Clinical Data: Preoperative assessment for right hip arthroplasty, history hypertension  CHEST - 2 VIEW  Comparison: None  Findings: Normal heart size, mediastinal contours, and pulmonary vascularity. Lungs clear. No pleural effusion or pneumothorax. Bones unremarkable.  IMPRESSION: No acute abnormalities.   Original  Report Authenticated By: Ulyses Southward, M.D.    Dg Hip Complete Right  12/17/2012  *RADIOLOGY REPORT*  Clinical Data: Preoperative assessment for right total hip replacement  RIGHT HIP - COMPLETE 2+ VIEW  Comparison: None  Findings: Osseous mineralization grossly normal for technique. SI joint spaces symmetric. Bilateral hip joint space narrowing and spur formation greater on the right with bone-on-bone appearance at the superior aspect of the right hip. Mild subchondral cystic changes superiorly within right femoral head. No acute fracture, dislocation, or bone destruction.  IMPRESSION: Osteoarthritic changes of bilateral hip joints greater on the right.   Original Report Authenticated By: Ulyses Southward, M.D.    Dg Pelvis Portable  12/31/2012  *RADIOLOGY REPORT*  Clinical Data: Post reduction of a right hip arthroplasty  PORTABLE PELVIS  Comparison: 12/31/2012 at 1331 hours  Findings: On this single view, the femoral head now lies well- aligned with the acetabular component.  The orientation of the acetabular component is unchanged from the earlier exam.  There is no fracture or evidence of prosthetic loosening.  IMPRESSION: Successful reduction of the right hip prosthesis based on a single AP view.   Original Report Authenticated By: Amie Portland, M.D.    Dg Pelvis Portable  12/31/2012  *RADIOLOGY REPORT*  Clinical Data: Status post right total hip arthroplasty.  PORTABLE PELVIS  Comparison: Single view pelvis from the same day.  Findings: The patient is  status post right total hip arthroplasty. The femoral head is E centered within the joint and appears slightly subluxed laterally and superiorly.  The orientation of the acetabular head is somewhat more vertical than typically seen.  IMPRESSION:  1.  Stable appearance of subluxation of the right hip joint following right total hip arthroplasty. 2.  Somewhat vertical appearance of the acetabular component.   Original Report Authenticated By: Marin Roberts, M.D.    Dg Pelvis Portable  12/31/2012  *RADIOLOGY REPORT*  Clinical Data: Postop right hip.  PORTABLE PELVIS  Comparison: Preop radiographs 12/17/2012  Findings: The patient is status post right total hip arthroplasty. The acetabular component appears somewhat vertically oriented.  The femoral head prosthesis is not centered within the acetabular cup.  IMPRESSION:  1.  Status post right total hip arthroplasty. The femoral head is partially subluxed from the joint prosthesis. 2.  More vertical orientation of the acetabular component than typically seen.  These results were called by telephone on 12/31/2012 at 2 o'clock p.m. to Dr. Lequita Halt, who verbally acknowledged these results.   Original Report Authenticated By: Marin Roberts, M.D.    Dg C-arm 61-120 Min-no Report  12/31/2012  CLINICAL DATA: intra op   C-ARM 61-120 MINUTES  Fluoroscopy was utilized by the requesting physician.  No radiographic  interpretation.      EKG: Orders placed during the hospital encounter of 12/17/12  . EKG 12-LEAD  . EKG 12-LEAD     Hospital Course: Patient was admitted to [facility] and taken to the OR and underwent the above state procedure without complications.  Patient tolerated the procedure well and was later transferred to the recovery room and then to the orthopaedic floor for postoperative care.  They were given PO and IV analgesics for pain control following their surgery.  They were given 24 hours of postoperative antibiotics of    Anti-infectives   Start     Dose/Rate Route Frequency Ordered Stop   12/31/12 1700  ceFAZolin (ANCEF) IVPB 2 g/50 mL premix     2 g 100 mL/hr over 30 Minutes  Intravenous Every 6 hours 12/31/12 1532 12/31/12 2338   12/31/12 0830  ceFAZolin (ANCEF) IVPB 2 g/50 mL premix     2 g 100 mL/hr over 30 Minutes Intravenous On call to O.R. 12/31/12 4540 12/31/12 1103     and started on DVT prophylaxis in the form of Xarelto.   PT and OT were ordered for total hip protocol.  The patient was allowed to be WBAT with therapy. Discharge planning was consulted to help with postop disposition and equipment needs.  Patient had a decent night on the evening of surgery.  They started to get up OOB with therapy on day one.  Hemovac drain was pulled without difficulty.  The knee immobilizer was removed and discontinued.  Continued to work with therapy into day two.  Dressing was changed on day two and the incision was healing well.  Patient was seen in rounds and was ready to go home later that same day after therapy.   Discharge Medications: Prior to Admission medications   Medication Sig Start Date End Date Taking? Authorizing Provider  atorvastatin (LIPITOR) 40 MG tablet Take 40 mg by mouth every evening.   Yes Historical Provider, MD  fenofibrate micronized (LOFIBRA) 200 MG capsule Take 200 mg by mouth every evening.   Yes Historical Provider, MD  lisinopril (PRINIVIL,ZESTRIL) 10 MG tablet Take 10 mg by mouth daily before breakfast.   Yes Historical Provider, MD  metoprolol succinate (TOPROL-XL) 50 MG 24 hr tablet Take 75 mg by mouth daily before breakfast. Take with or immediately following a meal.   Yes Historical Provider, MD  methocarbamol (ROBAXIN) 500 MG tablet Take 1 tablet (500 mg total) by mouth every 6 (six) hours as needed. 01/02/13   Alexzandrew Perkins, PA-C  oxyCODONE (OXY IR/ROXICODONE) 5 MG immediate release tablet Take 1-2 tablets (5-10 mg total) by mouth every 3 (three) hours as needed. 01/02/13    Alexzandrew Julien Girt, PA-C  rivaroxaban (XARELTO) 10 MG TABS tablet Take 1 tablet (10 mg total) by mouth daily with breakfast. Take Xarelto for two and a half more weeks, then discontinue Xarelto. 01/02/13   Alexzandrew Perkins, PA-C  traMADol (ULTRAM) 50 MG tablet Take 1-2 tablets (50-100 mg total) by mouth every 6 (six) hours as needed (mild pain). 01/02/13   Alexzandrew Julien Girt, PA-C    Diet: Cardiac diet and Diabetic diet Activity:WBAT No bending hip over 90 degrees- A "L" Angle Do not cross legs Do not let foot roll inward When turning these patients a pillow should be placed between the patient's legs to prevent crossing. Patients should have the affected knee fully extended when trying to sit or stand from all surfaces to prevent excessive hip flexion. When ambulating and turning toward the affected side the affected leg should have the toes turned out prior to moving the walker and the rest of patient's body as to prevent internal rotation/ turning in of the leg. Abduction pillows are the most effective way to prevent a patient from not crossing legs or turning toes in at rest. If an abduction pillow is not ordered placing a regular pillow length wise between the patient's legs is also an effective reminder. It is imperative that these precautions be maintained so that the surgical hip does not dislocate. Follow-up:in 2 weeks Disposition - Home Discharged Condition: good   Discharge Orders   Future Orders Complete By Expires     Call MD / Call 911  As directed     Comments:      If  you experience chest pain or shortness of breath, CALL 911 and be transported to the hospital emergency room.  If you develope a fever above 101 F, pus (white drainage) or increased drainage or redness at the wound, or calf pain, call your surgeon's office.    Change dressing  As directed     Comments:      You may change your dressing dressing daily with sterile 4 x 4 inch gauze dressing and paper tape.   Do not submerge the incision under water.    Constipation Prevention  As directed     Comments:      Drink plenty of fluids.  Prune juice may be helpful.  You may use a stool softener, such as Colace (over the counter) 100 mg twice a day.  Use MiraLax (over the counter) for constipation as needed.    Diet - low sodium heart healthy  As directed     Diet Carb Modified  As directed     Discharge instructions  As directed     Comments:      Pick up stool softner and laxative for home. Do not submerge incision under water. May shower. Continue to use ice for pain and swelling from surgery. Total Hip Protocol.  Take Xarelto for two and a half more weeks, then discontinue Xarelto.    Do not sit on low chairs, stoools or toilet seats, as it may be difficult to get up from low surfaces  As directed     Driving restrictions  As directed     Comments:      No driving until released by the physician.    Follow the hip precautions as taught in Physical Therapy  As directed     Increase activity slowly as tolerated  As directed     Lifting restrictions  As directed     Comments:      No lifting until released by the physician.    Patient may shower  As directed     Comments:      You may shower without a dressing once there is no drainage.  Do not wash over the wound.  If drainage remains, do not shower until drainage stops.    TED hose  As directed     Comments:      Use stockings (TED hose) for 3 weeks on both leg(s).  You may remove them at night for sleeping.    Weight bearing as tolerated  As directed         Medication List    STOP taking these medications       glucosamine-chondroitin 500-400 MG tablet     multivitamin with minerals Tabs     Tart Cherry Advanced Caps      TAKE these medications       atorvastatin 40 MG tablet  Commonly known as:  LIPITOR  Take 40 mg by mouth every evening.     fenofibrate micronized 200 MG capsule  Commonly known as:  LOFIBRA  Take 200  mg by mouth every evening.     lisinopril 10 MG tablet  Commonly known as:  PRINIVIL,ZESTRIL  Take 10 mg by mouth daily before breakfast.     methocarbamol 500 MG tablet  Commonly known as:  ROBAXIN  Take 1 tablet (500 mg total) by mouth every 6 (six) hours as needed.     metoprolol succinate 50 MG 24 hr tablet  Commonly known as:  TOPROL-XL  Take 75  mg by mouth daily before breakfast. Take with or immediately following a meal.     oxyCODONE 5 MG immediate release tablet  Commonly known as:  Oxy IR/ROXICODONE  Take 1-2 tablets (5-10 mg total) by mouth every 3 (three) hours as needed.     rivaroxaban 10 MG Tabs tablet  Commonly known as:  XARELTO  Take 1 tablet (10 mg total) by mouth daily with breakfast. Take Xarelto for two and a half more weeks, then discontinue Xarelto.     traMADol 50 MG tablet  Commonly known as:  ULTRAM  Take 1-2 tablets (50-100 mg total) by mouth every 6 (six) hours as needed (mild pain).           Follow-up Information   Follow up with Loanne Drilling, MD. Schedule an appointment as soon as possible for a visit in 2 weeks.   Contact information:   [address] [place] [address] [phone]       Signed: Patrica Duel 01/02/2013, 7:02 AM

## 2014-01-18 ENCOUNTER — Other Ambulatory Visit: Payer: Self-pay | Admitting: Family Medicine

## 2014-01-18 DIAGNOSIS — R1011 Right upper quadrant pain: Secondary | ICD-10-CM

## 2014-01-21 ENCOUNTER — Ambulatory Visit
Admission: RE | Admit: 2014-01-21 | Discharge: 2014-01-21 | Disposition: A | Discharge status: Home / Self Care | Payer: BC Managed Care – PPO | Source: Ambulatory Visit | Attending: Family Medicine | Admitting: Family Medicine

## 2014-01-21 DIAGNOSIS — R1011 Right upper quadrant pain: Secondary | ICD-10-CM

## 2014-01-22 ENCOUNTER — Other Ambulatory Visit: Payer: BC Managed Care – PPO

## 2014-06-04 ENCOUNTER — Encounter: Payer: Self-pay | Admitting: *Deleted

## 2014-08-26 ENCOUNTER — Ambulatory Visit: Payer: Self-pay | Admitting: Orthopedic Surgery

## 2014-08-26 NOTE — Progress Notes (Signed)
Preoperative surgical orders have been place into the Epic hospital system for Kevin Lucero on 08/26/2014, 11:19 AM  by Patrica DuelPERKINS, ALEXZANDREW for surgery on 09/08/2014.  Preop Total Hip - Anterior Approach orders including Experel Injecion, IV Tylenol, and IV Decadron as long as there are no contraindications to the above medications. [name], PA-C

## 2014-08-30 ENCOUNTER — Ambulatory Visit (HOSPITAL_COMMUNITY)
Admission: RE | Admit: 2014-08-30 | Discharge: 2014-08-30 | Disposition: A | Discharge status: Home / Self Care | Payer: BC Managed Care – PPO | Source: Ambulatory Visit | Attending: Anesthesiology | Admitting: Anesthesiology

## 2014-08-30 ENCOUNTER — Encounter (HOSPITAL_COMMUNITY): Payer: Self-pay

## 2014-08-30 ENCOUNTER — Encounter (HOSPITAL_COMMUNITY)
Admission: RE | Admit: 2014-08-30 | Discharge: 2014-08-30 | Disposition: A | Discharge status: Home / Self Care | Payer: BC Managed Care – PPO | Source: Ambulatory Visit | Attending: Orthopedic Surgery | Admitting: Orthopedic Surgery

## 2014-08-30 DIAGNOSIS — M1612 Unilateral primary osteoarthritis, left hip: Secondary | ICD-10-CM

## 2014-08-30 DIAGNOSIS — I10 Essential (primary) hypertension: Secondary | ICD-10-CM

## 2014-08-30 DIAGNOSIS — Z01818 Encounter for other preprocedural examination: Secondary | ICD-10-CM | POA: Diagnosis present

## 2014-08-30 LAB — COMPREHENSIVE METABOLIC PANEL
ALT: 33 U/L (ref 0–53)
AST: 40 U/L — ABNORMAL HIGH (ref 0–37)
Albumin: 4.2 g/dL (ref 3.5–5.2)
Alkaline Phosphatase: 47 U/L (ref 39–117)
Anion gap: 11 (ref 5–15)
BUN: 16 mg/dL (ref 6–23)
CO2: 26 mEq/L (ref 19–32)
Calcium: 9.7 mg/dL (ref 8.4–10.5)
Chloride: 100 mEq/L (ref 96–112)
Creatinine, Ser: 1.25 mg/dL (ref 0.50–1.35)
GFR calc Af Amer: 72 mL/min — ABNORMAL LOW (ref >90)
GFR calc non Af Amer: 62 mL/min — ABNORMAL LOW (ref >90)
Glucose, Bld: 107 mg/dL — ABNORMAL HIGH (ref 70–99)
Potassium: 4.5 mEq/L (ref 3.7–5.3)
Sodium: 137 mEq/L (ref 137–147)
Total Bilirubin: 0.2 mg/dL — ABNORMAL LOW (ref 0.3–1.2)
Total Protein: 8.1 g/dL (ref 6.0–8.3)

## 2014-08-30 LAB — PROTIME-INR
INR: 0.97 (ref 0.00–1.49)
Prothrombin Time: 13 seconds (ref 11.6–15.2)

## 2014-08-30 LAB — URINALYSIS, ROUTINE W REFLEX MICROSCOPIC
Bilirubin Urine: NEGATIVE
Glucose, UA: NEGATIVE mg/dL
Hgb urine dipstick: NEGATIVE
Ketones, ur: NEGATIVE mg/dL
Leukocytes, UA: NEGATIVE
Nitrite: NEGATIVE
Protein, ur: NEGATIVE mg/dL
Specific Gravity, Urine: 1.019 (ref 1.005–1.030)
Urobilinogen, UA: 0.2 mg/dL (ref 0.0–1.0)
pH: 7.5 (ref 5.0–8.0)

## 2014-08-30 LAB — SURGICAL PCR SCREEN
MRSA, PCR: NEGATIVE
Staphylococcus aureus: POSITIVE — AB

## 2014-08-30 LAB — CBC
HCT: 44.5 % (ref 39.0–52.0)
Hemoglobin: 15.4 g/dL (ref 13.0–17.0)
MCH: 31.7 pg (ref 26.0–34.0)
MCHC: 34.6 g/dL (ref 30.0–36.0)
MCV: 91.6 fL (ref 78.0–100.0)
Platelets: 299 10*3/uL (ref 150–400)
RBC: 4.86 MIL/uL (ref 4.22–5.81)
RDW: 12.9 % (ref 11.5–15.5)
WBC: 7.1 10*3/uL (ref 4.0–10.5)

## 2014-08-30 LAB — APTT: aPTT: 32 seconds (ref 24–37)

## 2014-08-30 NOTE — Patient Instructions (Signed)
[facility]  ON:   Wednesday  November 18  REPORT TO  SHORT STAY CENTER AT:   6:30 AM   DO NOT EAT OR DRINK ANYTHING AFTER MIDNIGHT THE NIGHT BEFORE YOUR SURGERY.  YOU MAY BRUSH YOUR TEETH, RINSE OUT YOUR MOUTH--BUT NO WATER, NO FOOD, NO CHEWING GUM, NO MINTS, NO CANDIES, NO CHEWING TOBACCO.  PLEASE TAKE THE FOLLOWING MEDICATIONS THE AM OF YOUR SURGERY WITH A FEW SIPS OF WATER:   METOPROLOL    DO NOT BRING VALUABLES, MONEY, CREDIT CARDS.  DO NOT WEAR JEWELRY, MAKE-UP, NAIL POLISH AND NO METAL PINS OR CLIPS IN YOUR HAIR. CONTACT LENS, DENTURES / PARTIALS, GLASSES SHOULD NOT BE WORN TO SURGERY AND IN MOST CASES-HEARING AIDS WILL NEED TO BE REMOVED.  BRING YOUR GLASSES CASE, ANY EQUIPMENT NEEDED FOR YOUR CONTACT LENS. FOR PATIENTS ADMITTED TO THE HOSPITAL--CHECK OUT TIME THE DAY OF DISCHARGE IS 11:00 AM.  ALL INPATIENT ROOMS ARE PRIVATE - WITH BATHROOM, TELEPHONE, TELEVISION AND WIFI INTERNET.    PLEASE BE AWARE THAT YOU MAY NEED ADDITIONAL BLOOD DRAWN DAY OF YOUR SURGERY  _______________________________________________________________________   [facility] - Preparing for [facility] Before surgery, you can play an important role.  Because skin is not sterile, your skin needs to be as free of germs as possible.  You can reduce the number of germs on your skin by washing with CHG (chlorahexidine gluconate) soap before surgery.  CHG is an antiseptic cleaner which kills germs and bonds with the skin to continue killing germs even after washing. Please DO NOT use if you have an allergy to CHG or antibacterial soaps.  If your skin becomes reddened/irritated stop using the CHG and inform your nurse when you arrive at Short Stay. Do not shave (including legs and underarms) for at least 48 hours prior to the first CHG shower.  You may shave your face/neck. Please follow these instructions carefully:  1.  Shower with CHG Soap the night before surgery and  the  morning of Surgery.  2.  If you choose to wash your hair, wash your hair first as usual with your  normal  shampoo.  3.  After you shampoo, rinse your hair and body thoroughly to remove the  shampoo.                           4.  Use CHG as you would any other liquid soap.  You can apply chg directly  to the skin and wash                       Gently with a scrungie or clean washcloth.  5.  Apply the CHG Soap to your body ONLY FROM THE NECK DOWN.   Do not use on face/ open                           Wound or open sores. Avoid contact with eyes, ears mouth and genitals (private parts).                       Wash face,  Genitals (private parts) with your normal soap.             6.  Wash thoroughly, paying special attention to the area where your surgery  will be performed.  7.  Thoroughly rinse your body  with warm water from the neck down.  8.  DO NOT shower/wash with your normal soap after using and rinsing off  the CHG Soap.                9.  Pat yourself dry with a clean towel.            10.  Wear clean pajamas.            11.  Place clean sheets on your bed the night of your first shower and do not  sleep with pets. Day of Surgery : Do not apply any lotions/deodorants the morning of surgery.  Please wear clean clothes to the hospital/surgery center.  FAILURE TO FOLLOW THESE INSTRUCTIONS MAY RESULT IN THE CANCELLATION OF YOUR SURGERY PATIENT SIGNATURE_________________________________  NURSE SIGNATURE__________________________________  ________________________________________________________________________   Rogelia Mire  An incentive spirometer is a tool that can help keep your lungs clear and active. This tool measures how well you are filling your lungs with each breath. Taking long deep breaths may help reverse or decrease the chance of developing breathing (pulmonary) problems (especially infection) following:  A long period of time when you are unable to move or be  active. BEFORE THE PROCEDURE   If the spirometer includes an indicator to show your best effort, your nurse or respiratory therapist will set it to a desired goal.  If possible, sit up straight or lean slightly forward. Try not to slouch.  Hold the incentive spirometer in an upright position. INSTRUCTIONS FOR USE   Sit on the edge of your bed if possible, or sit up as far as you can in bed or on a chair.  Hold the incentive spirometer in an upright position.  Breathe out normally.  Place the mouthpiece in your mouth and seal your lips tightly around it.  Breathe in slowly and as deeply as possible, raising the piston or the ball toward the top of the column.  Hold your breath for 3-5 seconds or for as long as possible. Allow the piston or ball to fall to the bottom of the column.  Remove the mouthpiece from your mouth and breathe out normally.  Rest for a few seconds and repeat Steps 1 through 7 at least 10 times every 1-2 hours when you are awake. Take your time and take a few normal breaths between deep breaths.  The spirometer may include an indicator to show your best effort. Use the indicator as a goal to work toward during each repetition.  After each set of 10 deep breaths, practice coughing to be sure your lungs are clear. If you have an incision (the cut made at the time of surgery), support your incision when coughing by placing a pillow or rolled up towels firmly against it. Once you are able to get out of bed, walk around indoors and cough well. You may stop using the incentive spirometer when instructed by your caregiver.  RISKS AND COMPLICATIONS  Take your time so you do not get dizzy or light-headed.  If you are in pain, you may need to take or ask for pain medication before doing incentive spirometry. It is harder to take a deep breath if you are having pain. AFTER USE  Rest and breathe slowly and easily.  It can be helpful to keep track of a log of your  progress. Your caregiver can provide you with a simple table to help with this. If you are using the spirometer at home, follow  these instructions: SEEK MEDICAL CARE IF:   You are having difficultly using the spirometer.  You have trouble using the spirometer as often as instructed.  Your pain medication is not giving enough relief while using the spirometer.  You develop fever of 100.5 F (38.1 C) or higher. SEEK IMMEDIATE MEDICAL CARE IF:   You cough up bloody sputum that had not been present before.  You develop fever of 102 F (38.9 C) or greater.  You develop worsening pain at or near the incision site. MAKE SURE YOU:   Understand these instructions.  Will watch your condition.  Will get help right away if you are not doing well or get worse. Document Released: 02/18/2007 Document Revised: 12/31/2011 Document Reviewed: 04/21/2007 ExitCare Patient Information 2014 ExitCare, [place].   ________________________________________________________________________  WHAT IS A BLOOD TRANSFUSION? Blood Transfusion Information  A transfusion is the replacement of blood or some of its parts. Blood is made up of multiple cells which provide different functions.  Red blood cells carry oxygen and are used for blood loss replacement.  White blood cells fight against infection.  Platelets control bleeding.  Plasma helps clot blood.  Other blood products are available for specialized needs, such as hemophilia or other clotting disorders. BEFORE THE TRANSFUSION  Who gives blood for transfusions?   Healthy volunteers who are fully evaluated to make sure their blood is safe. This is blood bank blood. Transfusion therapy is the safest it has ever been in the practice of medicine. Before blood is taken from a donor, a complete history is taken to make sure that person has no history of diseases nor engages in risky social behavior (examples are intravenous drug use or sexual activity with  multiple partners). The donor's travel history is screened to minimize risk of transmitting infections, such as malaria. The donated blood is tested for signs of infectious diseases, such as HIV and hepatitis. The blood is then tested to be sure it is compatible with you in order to minimize the chance of a transfusion reaction. If you or a relative donates blood, this is often done in anticipation of surgery and is not appropriate for emergency situations. It takes many days to process the donated blood. RISKS AND COMPLICATIONS Although transfusion therapy is very safe and saves many lives, the main dangers of transfusion include:   Getting an infectious disease.  Developing a transfusion reaction. This is an allergic reaction to something in the blood you were given. Every precaution is taken to prevent this. The decision to have a blood transfusion has been considered carefully by your caregiver before blood is given. Blood is not given unless the benefits outweigh the risks. AFTER THE TRANSFUSION  Right after receiving a blood transfusion, you will usually feel much better and more energetic. This is especially true if your red blood cells have gotten low (anemic). The transfusion raises the level of the red blood cells which carry oxygen, and this usually causes an energy increase.  The nurse administering the transfusion will monitor you carefully for complications. HOME CARE INSTRUCTIONS  No special instructions are needed after a transfusion. You may find your energy is better. Speak with your caregiver about any limitations on activity for underlying diseases you may have. SEEK MEDICAL CARE IF:   Your condition is not improving after your transfusion.  You develop redness or irritation at the intravenous (IV) site. SEEK IMMEDIATE MEDICAL CARE IF:  Any of the following symptoms occur over the next 12 hours:  Shaking chills.  You have a temperature by mouth above 102 F (38.9 C), not  controlled by medicine.  Chest, back, or muscle pain.  People around you feel you are not acting correctly or are confused.  Shortness of breath or difficulty breathing.  Dizziness and fainting.  You get a rash or develop hives.  You have a decrease in urine output.  Your urine turns a dark color or changes to pink, red, or brown. Any of the following symptoms occur over the next 10 days:  You have a temperature by mouth above 102 F (38.9 C), not controlled by medicine.  Shortness of breath.  Weakness after normal activity.  The white part of the eye turns yellow (jaundice).  You have a decrease in the amount of urine or are urinating less often.  Your urine turns a dark color or changes to pink, red, or brown. Document Released: 10/05/2000 Document Revised: 12/31/2011 Document Reviewed: 05/24/2008 [facility] Patient Information 2014 [place].  _______________________________________________________________________

## 2014-08-30 NOTE — Progress Notes (Signed)
   08/30/14 1449  OBSTRUCTIVE SLEEP APNEA  Have you ever been diagnosed with sleep apnea through a sleep study? No  Do you snore loudly (loud enough to be heard through closed doors)?  0  Do you often feel tired, fatigued, or sleepy during the daytime? 0  Has anyone observed you stop breathing during your sleep? 0  Do you have, or are you being treated for high blood pressure? 1  BMI more than 35 kg/m2? 0  Age over 828 years old? 1  Neck circumference greater than 40 cm/16 inches? 1  Gender: 1  Obstructive Sleep Apnea Score 4

## 2014-08-30 NOTE — Pre-Procedure Instructions (Signed)
EKG, CXRM LEFT HIP X[facility] NOTE OF MEDICAL CLEARANCE ON CHART FROM DR. W. MCNEIL. PT'S CHART GIVEN TO  FOLLOW UP NURSE BEVERLY NANNY, RN FOR REVIEW OF ALL PREOP LAB TEST RESULTS.

## 2014-09-02 ENCOUNTER — Other Ambulatory Visit: Payer: Self-pay | Admitting: Surgical

## 2014-09-02 NOTE — H&P (Signed)
TOTAL HIP ADMISSION H&P  Patient is admitted for left total hip arthroplasty.  Subjective:  Chief Complaint: left hip pain  HPI: Kevin Lucero, 58 y.o. male, has a history of pain and functional disability in the left hip(s) due to arthritis and patient has failed non-surgical conservative treatments for greater than 12 weeks to include NSAID's and/or analgesics, corticosteriod injections and activity modification.  Onset of symptoms was gradual starting 2 years ago with gradually worsening course since that time.The patient noted no past surgery on the left hip(s).  Patient currently rates pain in the left hip at 7 out of 10 with activity. Patient has night pain, worsening of pain with activity and weight bearing, pain that interfers with activities of daily living, pain with passive range of motion and crepitus. Patient has evidence of periarticular osteophytes and joint space narrowing by imaging studies. This condition presents safety issues increasing the risk of falls.  There is no current active infection.  Patient Active Problem List   Diagnosis Date Noted  . OA (osteoarthritis) of hip 12/31/2012   Past Medical History  Diagnosis Date  . Hypertension   . Diabetes mellitus without complication     DIET CONTROL  . GERD (gastroesophageal reflux disease)     OCCAS -- WOULD USE OTC MED IF NEEDED  . Arthritis     OA BOTH HIPS -S/P RIGHT TOTAL HIP REPLACEMENT    Past Surgical History  Procedure Laterality Date  . Wisdom teeth extracted    . Total hip arthroplasty Right 12/31/2012    Procedure: TOTAL HIP ARTHROPLASTY ANTERIOR APPROACH;  Surgeon: Loanne DrillingFrank V Aluisio, MD;  Location: [facility]S;  Service: Orthopedics;  Laterality: Right;     Current outpatient prescriptions:  atorvastatin (LIPITOR) 40 MG tablet, Take 40 mg by mouth every evening., Disp: , Rfl: ;   fenofibrate micronized (LOFIBRA) 200 MG capsule, Take 200 mg by mouth every evening., Disp: , Rfl: ;   glucosamine-chondroitin  500-400 MG tablet, Take 2 tablets by mouth daily., Disp: , Rfl: ;   lisinopril (PRINIVIL,ZESTRIL) 10 MG tablet, Take 10 mg by mouth daily before breakfast., Disp: , Rfl:  metoprolol succinate (TOPROL-XL) 50 MG 24 hr tablet, Take 75 mg by mouth daily before breakfast. Take with or immediately following a meal., Disp: , Rfl: ;   Multiple Vitamin (MULTIVITAMIN WITH MINERALS) TABS tablet, Take 1 tablet by mouth daily., Disp: , Rfl: ;   aspirin 81 MG tablet, Take 81 mg by mouth daily., Disp: , Rfl:   Allergies  Allergen Reactions  . Penicillins Other (See Comments)    Night sweats-bad dreams    History  Substance Use Topics  . Smoking status: Never Smoker   . Smokeless tobacco: Never Used  . Alcohol Use: Yes     Comment: 2 OR 3 MIXED DRINKS OR BEERS OR GLASSES WINE DAILY    Family History  Problem Relation Age of Onset  . Diabetes Mellitus II Mother   . Diabetes Mellitus II Father   . Heart disease Father      Review of Systems  Constitutional: Negative.   HENT: Negative.   Eyes: Negative.   Respiratory: Negative.   Cardiovascular: Negative.   Gastrointestinal: Negative.   Genitourinary: Negative.   Musculoskeletal: Positive for joint pain. Negative for myalgias, back pain, falls and neck pain.       Left hip pain  Skin: Negative.   Neurological: Negative.   Endo/Heme/Allergies: Negative.   Psychiatric/Behavioral: Negative.     Objective:  Physical Exam  Constitutional: He is oriented to person, place, and time. He appears well-developed. No distress.  Overweight  HENT:  Head: Normocephalic and atraumatic.  Right Ear: External ear normal.  Left Ear: External ear normal.  Nose: Nose normal.  Mouth/Throat: Oropharynx is clear and moist.  Eyes: Conjunctivae and EOM are normal.  Neck: Normal range of motion. Neck supple.  Cardiovascular: Normal rate, regular rhythm, normal heart sounds and intact distal pulses.   No murmur heard. Respiratory: Effort normal and breath  sounds normal. No respiratory distress. He has no wheezes.  GI: Soft. Bowel sounds are normal. He exhibits no distension. There is no tenderness.  Musculoskeletal:       Right hip: Normal.       Left hip: He exhibits decreased range of motion, decreased strength and crepitus.       Right knee: Normal.       Left knee: Normal.       Right lower leg: He exhibits no tenderness and no swelling.       Left lower leg: He exhibits no tenderness and no swelling.  His right hip can be flexed to 110 degrees, rotated in 30 degrees, rotated out 40 degrees, and abducted to 40 degrees without discomfort.  Left hip flexion to about 90 degrees, minimal internal rotation, about 20 degrees external rotation, and 20 degrees abduction.  Neurological: He is alert and oriented to person, place, and time. He has normal strength and normal reflexes. No sensory deficit.  Skin: He is not diaphoretic. No erythema.  Psychiatric: He has a normal mood and affect. His behavior is normal.    Vitals  Weight: 226 lb Height: 69in Body Surface Area: 2.18 m Body Mass Index: 33.37 kg/m  Pulse: 72 (Regular)  BP: 132/78 (Sitting, Left Arm, Standard)  Imaging Review Plain radiographs demonstrate severe degenerative joint disease of the left hip(s). The bone quality appears to be good for age and reported activity level.  Assessment/Plan:  End stage arthritis, left hip(s)  The patient history, physical examination, clinical judgement of the provider and imaging studies are consistent with end stage degenerative joint disease of the left hip(s) and total hip arthroplasty is deemed medically necessary. The treatment options including medical management, injection therapy, arthroscopy and arthroplasty were discussed at length. The risks and benefits of total hip arthroplasty were presented and reviewed. The risks due to aseptic loosening, infection, stiffness, dislocation/subluxation,  thromboembolic complications and  other imponderables were discussed.  The patient acknowledged the explanation, agreed to proceed with the plan and consent was signed. Patient is being admitted for inpatient treatment for surgery, pain control, PT, OT, prophylactic antibiotics, VTE prophylaxis, progressive ambulation and ADL's and discharge planning.The patient is planning to be discharged home with home health services   TXA IV  PCP: Dr. Selena BattenWendy McNeil  Dimitri PedAmber Zarinah Oviatt, PA-C

## 2014-09-06 NOTE — Anesthesia Preprocedure Evaluation (Signed)
Anesthesia Evaluation  Patient identified by MRN, date of birth, ID band Patient awake    Reviewed: Allergy & Precautions, H&P , NPO status , Patient's Chart, lab work & pertinent test results  History of Anesthesia Complications Negative for: history of anesthetic complications  Airway Mallampati: II  TM Distance: >3 FB Neck ROM: Full    Dental no notable dental hx. (+) Dental Advisory Given   Pulmonary neg pulmonary ROS,  breath sounds clear to auscultation  Pulmonary exam normal       Cardiovascular hypertension, Pt. on home beta blockers and Pt. on medications Rhythm:Regular Rate:Normal     Neuro/Psych negative neurological ROS  negative psych ROS   GI/Hepatic Neg liver ROS, GERD-  Medicated and Controlled,  Endo/Other  diabetes, Type 2  Renal/GU negative Renal ROS  negative genitourinary   Musculoskeletal  (+) Arthritis -, Osteoarthritis,    Abdominal (+) + obese,   Peds negative pediatric ROS (+)  Hematology negative hematology ROS (+)   Anesthesia Other Findings   Reproductive/Obstetrics negative OB ROS                             Anesthesia Physical Anesthesia Plan  ASA: II  Anesthesia Plan: Spinal   Post-op Pain Management:    Induction: Intravenous  Airway Management Planned: Nasal Cannula  Additional Equipment:   Intra-op Plan:   Post-operative Plan: Extubation in OR  Informed Consent: I have reviewed the patients History and Physical, chart, labs and discussed the procedure including the risks, benefits and alternatives for the proposed anesthesia with the patient or authorized representative who has indicated his/her understanding and acceptance.   Dental advisory given  Plan Discussed with: CRNA  Anesthesia Plan Comments:         Anesthesia Quick Evaluation

## 2014-09-07 MED ORDER — VANCOMYCIN HCL 10 G IV SOLR
1500.0000 mg | INTRAVENOUS | Status: AC
  Administered 2014-09-08: 1500 mg via INTRAVENOUS
  Filled 2014-09-07 (×2): qty 1500

## 2014-09-08 ENCOUNTER — Encounter (HOSPITAL_COMMUNITY)
Admission: RE | Disposition: A | Discharge status: Home / Self Care | Payer: Self-pay | Source: Ambulatory Visit | Attending: Orthopedic Surgery

## 2014-09-08 ENCOUNTER — Inpatient Hospital Stay (HOSPITAL_COMMUNITY): Payer: BC Managed Care – PPO

## 2014-09-08 ENCOUNTER — Inpatient Hospital Stay (HOSPITAL_COMMUNITY): Payer: BC Managed Care – PPO | Admitting: Anesthesiology

## 2014-09-08 ENCOUNTER — Encounter (HOSPITAL_COMMUNITY): Payer: Self-pay | Admitting: Registered Nurse

## 2014-09-08 ENCOUNTER — Inpatient Hospital Stay (HOSPITAL_COMMUNITY)
Admission: RE | Admit: 2014-09-08 | Discharge: 2014-09-10 | DRG: 470 | Disposition: A | Discharge status: Home / Self Care | Payer: BC Managed Care – PPO | Source: Ambulatory Visit | Attending: Orthopedic Surgery | Admitting: Orthopedic Surgery

## 2014-09-08 DIAGNOSIS — Z880 Allergy status to penicillin: Secondary | ICD-10-CM | POA: Diagnosis not present

## 2014-09-08 DIAGNOSIS — E119 Type 2 diabetes mellitus without complications: Secondary | ICD-10-CM | POA: Diagnosis present

## 2014-09-08 DIAGNOSIS — M25552 Pain in left hip: Secondary | ICD-10-CM | POA: Diagnosis present

## 2014-09-08 DIAGNOSIS — M1612 Unilateral primary osteoarthritis, left hip: Secondary | ICD-10-CM | POA: Diagnosis present

## 2014-09-08 DIAGNOSIS — Z96649 Presence of unspecified artificial hip joint: Secondary | ICD-10-CM

## 2014-09-08 DIAGNOSIS — M169 Osteoarthritis of hip, unspecified: Secondary | ICD-10-CM | POA: Diagnosis present

## 2014-09-08 DIAGNOSIS — K219 Gastro-esophageal reflux disease without esophagitis: Secondary | ICD-10-CM | POA: Diagnosis present

## 2014-09-08 DIAGNOSIS — I10 Essential (primary) hypertension: Secondary | ICD-10-CM | POA: Diagnosis present

## 2014-09-08 DIAGNOSIS — Z96641 Presence of right artificial hip joint: Secondary | ICD-10-CM | POA: Diagnosis present

## 2014-09-08 HISTORY — PX: TOTAL HIP ARTHROPLASTY: SHX124

## 2014-09-08 LAB — GLUCOSE, CAPILLARY
Glucose-Capillary: 136 mg/dL — ABNORMAL HIGH (ref 70–99)
Glucose-Capillary: 122 mg/dL — ABNORMAL HIGH (ref 70–99)

## 2014-09-08 LAB — TYPE AND SCREEN
ABO/RH(D): A POS
Antibody Screen: NEGATIVE

## 2014-09-08 SURGERY — ARTHROPLASTY, HIP, TOTAL, ANTERIOR APPROACH
Anesthesia: Spinal | Site: Hip | Laterality: Left

## 2014-09-08 MED ORDER — SODIUM CHLORIDE 0.9 % IJ SOLN
INTRAMUSCULAR | Status: DC | PRN
  Administered 2014-09-08: 30 mL

## 2014-09-08 MED ORDER — MORPHINE SULFATE 2 MG/ML IJ SOLN
1.0000 mg | INTRAMUSCULAR | Status: DC | PRN
  Administered 2014-09-08: 2 mg via INTRAVENOUS
  Administered 2014-09-08: 1 mg via INTRAVENOUS
  Filled 2014-09-08 (×2): qty 1

## 2014-09-08 MED ORDER — METHOCARBAMOL 1000 MG/10ML IJ SOLN
500.0000 mg | Freq: Four times a day (QID) | INTRAMUSCULAR | Status: DC | PRN
  Filled 2014-09-08: qty 5

## 2014-09-08 MED ORDER — FENTANYL CITRATE 0.05 MG/ML IJ SOLN
INTRAMUSCULAR | Status: AC
  Filled 2014-09-08: qty 2

## 2014-09-08 MED ORDER — EPHEDRINE SULFATE 50 MG/ML IJ SOLN
INTRAMUSCULAR | Status: AC
  Filled 2014-09-08: qty 1

## 2014-09-08 MED ORDER — MIDAZOLAM HCL 2 MG/2ML IJ SOLN
INTRAMUSCULAR | Status: AC
  Filled 2014-09-08: qty 2

## 2014-09-08 MED ORDER — ACETAMINOPHEN 650 MG RE SUPP: 650.0000 mg | Freq: Four times a day (QID) | RECTAL | Status: DC | PRN

## 2014-09-08 MED ORDER — BUPIVACAINE HCL (PF) 0.25 % IJ SOLN
INTRAMUSCULAR | Status: AC
  Filled 2014-09-08: qty 30

## 2014-09-08 MED ORDER — BUPIVACAINE HCL (PF) 0.5 % IJ SOLN
INTRAMUSCULAR | Status: AC
  Filled 2014-09-08: qty 30

## 2014-09-08 MED ORDER — KETOROLAC TROMETHAMINE 15 MG/ML IJ SOLN: 7.5000 mg | Freq: Four times a day (QID) | INTRAMUSCULAR | Status: AC | PRN

## 2014-09-08 MED ORDER — BUPIVACAINE HCL (PF) 0.5 % IJ SOLN
INTRAMUSCULAR | Status: DC | PRN
  Administered 2014-09-08: 15 mg

## 2014-09-08 MED ORDER — PHENYLEPHRINE HCL 10 MG/ML IJ SOLN
10.0000 mg | INTRAVENOUS | Status: DC | PRN
  Administered 2014-09-08: 10 ug/min via INTRAVENOUS

## 2014-09-08 MED ORDER — PROPOFOL 10 MG/ML IV BOLUS
INTRAVENOUS | Status: AC
  Filled 2014-09-08: qty 20

## 2014-09-08 MED ORDER — TRANEXAMIC ACID 100 MG/ML IV SOLN
1000.0000 mg | INTRAVENOUS | Status: AC
  Administered 2014-09-08: 1000 mg via INTRAVENOUS
  Filled 2014-09-08: qty 10

## 2014-09-08 MED ORDER — METOCLOPRAMIDE HCL 5 MG/ML IJ SOLN
5.0000 mg | Freq: Three times a day (TID) | INTRAMUSCULAR | Status: DC | PRN
Start: 2014-09-08 — End: 2014-09-10

## 2014-09-08 MED ORDER — PROPOFOL 10 MG/ML IV BOLUS
INTRAVENOUS | Status: DC | PRN
  Administered 2014-09-08: 20 mg via INTRAVENOUS

## 2014-09-08 MED ORDER — ATORVASTATIN CALCIUM 40 MG PO TABS
40.0000 mg | ORAL_TABLET | Freq: Every evening | ORAL | Status: DC
  Administered 2014-09-08 – 2014-09-09 (×2): 40 mg via ORAL
  Filled 2014-09-08 (×4): qty 1

## 2014-09-08 MED ORDER — ONDANSETRON HCL 4 MG/2ML IJ SOLN: 4.0000 mg | Freq: Four times a day (QID) | INTRAMUSCULAR | Status: DC | PRN

## 2014-09-08 MED ORDER — ACETAMINOPHEN 10 MG/ML IV SOLN
1000.0000 mg | Freq: Once | INTRAVENOUS | Status: AC
  Administered 2014-09-08: 1000 mg via INTRAVENOUS
  Filled 2014-09-08 (×2): qty 100

## 2014-09-08 MED ORDER — DEXAMETHASONE SODIUM PHOSPHATE 10 MG/ML IJ SOLN
INTRAMUSCULAR | Status: AC
  Filled 2014-09-08: qty 1

## 2014-09-08 MED ORDER — DOCUSATE SODIUM 100 MG PO CAPS
100.0000 mg | ORAL_CAPSULE | Freq: Two times a day (BID) | ORAL | Status: DC
  Administered 2014-09-08 – 2014-09-10 (×4): 100 mg via ORAL

## 2014-09-08 MED ORDER — ONDANSETRON HCL 4 MG PO TABS: 4.0000 mg | ORAL_TABLET | Freq: Four times a day (QID) | ORAL | Status: DC | PRN

## 2014-09-08 MED ORDER — RIVAROXABAN 10 MG PO TABS
10.0000 mg | ORAL_TABLET | Freq: Every day | ORAL | Status: DC
  Administered 2014-09-09 – 2014-09-10 (×2): 10 mg via ORAL
  Filled 2014-09-08 (×3): qty 1

## 2014-09-08 MED ORDER — PROPOFOL INFUSION 10 MG/ML OPTIME
INTRAVENOUS | Status: DC | PRN
  Administered 2014-09-08: 150 ug/kg/min via INTRAVENOUS

## 2014-09-08 MED ORDER — METOPROLOL SUCCINATE ER 50 MG PO TB24
75.0000 mg | ORAL_TABLET | Freq: Every day | ORAL | Status: DC
  Administered 2014-09-09 – 2014-09-10 (×2): 75 mg via ORAL
  Filled 2014-09-08 (×3): qty 1

## 2014-09-08 MED ORDER — PHENYLEPHRINE 40 MCG/ML (10ML) SYRINGE FOR IV PUSH (FOR BLOOD PRESSURE SUPPORT)
PREFILLED_SYRINGE | INTRAVENOUS | Status: AC
  Filled 2014-09-08: qty 10

## 2014-09-08 MED ORDER — OXYCODONE HCL 5 MG PO TABS
5.0000 mg | ORAL_TABLET | ORAL | Status: DC | PRN
  Administered 2014-09-08 – 2014-09-10 (×13): 10 mg via ORAL
  Filled 2014-09-08 (×13): qty 2

## 2014-09-08 MED ORDER — ACETAMINOPHEN 325 MG PO TABS: 650.0000 mg | ORAL_TABLET | Freq: Four times a day (QID) | ORAL | Status: DC | PRN

## 2014-09-08 MED ORDER — PHENYLEPHRINE HCL 10 MG/ML IJ SOLN
INTRAMUSCULAR | Status: DC | PRN
  Administered 2014-09-08: 80 ug via INTRAVENOUS

## 2014-09-08 MED ORDER — FLEET ENEMA 7-19 GM/118ML RE ENEM: 1.0000 | ENEMA | Freq: Once | RECTAL | Status: AC | PRN

## 2014-09-08 MED ORDER — PHENYLEPHRINE HCL 10 MG/ML IJ SOLN
INTRAMUSCULAR | Status: AC
  Filled 2014-09-08: qty 1

## 2014-09-08 MED ORDER — MIDAZOLAM HCL 5 MG/5ML IJ SOLN
INTRAMUSCULAR | Status: DC | PRN
  Administered 2014-09-08 (×2): 2 mg via INTRAVENOUS

## 2014-09-08 MED ORDER — MENTHOL 3 MG MT LOZG
1.0000 | LOZENGE | OROMUCOSAL | Status: DC | PRN
  Filled 2014-09-08: qty 9

## 2014-09-08 MED ORDER — CHLORHEXIDINE GLUCONATE 4 % EX LIQD: 60.0000 mL | Freq: Once | CUTANEOUS | Status: DC

## 2014-09-08 MED ORDER — BUPIVACAINE HCL (PF) 0.25 % IJ SOLN
INTRAMUSCULAR | Status: DC | PRN
Start: 2014-09-08 — End: 2014-09-08
  Administered 2014-09-08: 20 mL

## 2014-09-08 MED ORDER — METHOCARBAMOL 500 MG PO TABS
500.0000 mg | ORAL_TABLET | Freq: Four times a day (QID) | ORAL | Status: DC | PRN
  Administered 2014-09-09 – 2014-09-10 (×5): 500 mg via ORAL
  Filled 2014-09-08 (×5): qty 1

## 2014-09-08 MED ORDER — DIPHENHYDRAMINE HCL 12.5 MG/5ML PO ELIX
12.5000 mg | ORAL_SOLUTION | ORAL | Status: DC | PRN
Start: 2014-09-08 — End: 2014-09-10

## 2014-09-08 MED ORDER — DEXAMETHASONE SODIUM PHOSPHATE 10 MG/ML IJ SOLN
10.0000 mg | Freq: Once | INTRAMUSCULAR | Status: DC
  Filled 2014-09-08: qty 1

## 2014-09-08 MED ORDER — ONDANSETRON HCL 4 MG/2ML IJ SOLN: 4.0000 mg | Freq: Once | INTRAMUSCULAR | Status: DC | PRN

## 2014-09-08 MED ORDER — BUPIVACAINE LIPOSOME 1.3 % IJ SUSP
20.0000 mL | Freq: Once | INTRAMUSCULAR | Status: AC
  Administered 2014-09-08: 20 mL
  Filled 2014-09-08: qty 20

## 2014-09-08 MED ORDER — SODIUM CHLORIDE 0.9 % IJ SOLN
INTRAMUSCULAR | Status: AC
  Filled 2014-09-08: qty 50

## 2014-09-08 MED ORDER — FENTANYL CITRATE 0.05 MG/ML IJ SOLN: 25.0000 ug | INTRAMUSCULAR | Status: DC | PRN

## 2014-09-08 MED ORDER — VANCOMYCIN HCL IN DEXTROSE 1-5 GM/200ML-% IV SOLN
1000.0000 mg | Freq: Two times a day (BID) | INTRAVENOUS | Status: AC
  Administered 2014-09-08: 1000 mg via INTRAVENOUS
  Filled 2014-09-08: qty 200

## 2014-09-08 MED ORDER — DEXAMETHASONE SODIUM PHOSPHATE 10 MG/ML IJ SOLN
INTRAMUSCULAR | Status: AC
Start: 2014-09-08 — End: 2014-09-08
  Filled 2014-09-08: qty 1

## 2014-09-08 MED ORDER — SODIUM CHLORIDE 0.9 % IV SOLN: INTRAVENOUS | Status: DC

## 2014-09-08 MED ORDER — ACETAMINOPHEN 500 MG PO TABS
1000.0000 mg | ORAL_TABLET | Freq: Four times a day (QID) | ORAL | Status: AC
  Administered 2014-09-08 – 2014-09-09 (×4): 1000 mg via ORAL
  Filled 2014-09-08 (×4): qty 2

## 2014-09-08 MED ORDER — POLYETHYLENE GLYCOL 3350 17 G PO PACK: 17.0000 g | PACK | Freq: Every day | ORAL | Status: DC | PRN

## 2014-09-08 MED ORDER — PHENOL 1.4 % MT LIQD
1.0000 | OROMUCOSAL | Status: DC | PRN
  Filled 2014-09-08: qty 177

## 2014-09-08 MED ORDER — TRAMADOL HCL 50 MG PO TABS
50.0000 mg | ORAL_TABLET | Freq: Four times a day (QID) | ORAL | Status: DC | PRN
  Administered 2014-09-09: 100 mg via ORAL
  Filled 2014-09-08: qty 2

## 2014-09-08 MED ORDER — METOCLOPRAMIDE HCL 10 MG PO TABS: 5.0000 mg | ORAL_TABLET | Freq: Three times a day (TID) | ORAL | Status: DC | PRN

## 2014-09-08 MED ORDER — SODIUM CHLORIDE 0.9 % IV SOLN
INTRAVENOUS | Status: DC
  Administered 2014-09-08 – 2014-09-09 (×2): via INTRAVENOUS

## 2014-09-08 MED ORDER — DEXAMETHASONE SODIUM PHOSPHATE 10 MG/ML IJ SOLN
10.0000 mg | Freq: Once | INTRAMUSCULAR | Status: AC
  Administered 2014-09-08: 10 mg via INTRAVENOUS

## 2014-09-08 MED ORDER — LACTATED RINGERS IV SOLN
INTRAVENOUS | Status: DC | PRN
  Administered 2014-09-08 (×2): via INTRAVENOUS

## 2014-09-08 MED ORDER — BISACODYL 10 MG RE SUPP: 10.0000 mg | Freq: Every day | RECTAL | Status: DC | PRN

## 2014-09-08 MED ORDER — FENTANYL CITRATE 0.05 MG/ML IJ SOLN
INTRAMUSCULAR | Status: DC | PRN
  Administered 2014-09-08 (×2): 50 ug via INTRAVENOUS

## 2014-09-08 SURGICAL SUPPLY — 39 items
BAG ZIPLOCK 12X15 (MISCELLANEOUS) IMPLANT
BLADE EXTENDED COATED 6.5IN (ELECTRODE) ×2 IMPLANT
BLADE SAG 18X100X1.27 (BLADE) ×2 IMPLANT
CAPT HIP PF COP ×2 IMPLANT
COVER PERINEAL POST (MISCELLANEOUS) ×2 IMPLANT
DECANTER SPIKE VIAL GLASS SM (MISCELLANEOUS) ×2 IMPLANT
DRAPE C-ARM 42X120 X-RAY (DRAPES) ×2 IMPLANT
DRAPE STERI IOBAN 125X83 (DRAPES) ×2 IMPLANT
DRAPE U-SHAPE 47X51 STRL (DRAPES) ×6 IMPLANT
DRSG ADAPTIC 3X8 NADH LF (GAUZE/BANDAGES/DRESSINGS) ×2 IMPLANT
DRSG MEPILEX BORDER 4X4 (GAUZE/BANDAGES/DRESSINGS) ×2 IMPLANT
DRSG MEPILEX BORDER 4X8 (GAUZE/BANDAGES/DRESSINGS) ×2 IMPLANT
DURAPREP 26ML APPLICATOR (WOUND CARE) ×2 IMPLANT
ELECT REM PT RETURN 9FT ADLT (ELECTROSURGICAL) ×2
ELECTRODE REM PT RTRN 9FT ADLT (ELECTROSURGICAL) ×1 IMPLANT
EVACUATOR 1/8 PVC DRAIN (DRAIN) ×2 IMPLANT
FACESHIELD WRAPAROUND (MASK) ×8 IMPLANT
GLOVE BIO SURGEON STRL SZ7.5 (GLOVE) ×2 IMPLANT
GLOVE BIO SURGEON STRL SZ8 (GLOVE) ×4 IMPLANT
GLOVE BIOGEL PI IND STRL 8 (GLOVE) ×2 IMPLANT
GLOVE BIOGEL PI INDICATOR 8 (GLOVE) ×2
GOWN STRL REUS W/TWL LRG LVL3 (GOWN DISPOSABLE) ×2 IMPLANT
GOWN STRL REUS W/TWL XL LVL3 (GOWN DISPOSABLE) ×2 IMPLANT
KIT BASIN OR (CUSTOM PROCEDURE TRAY) ×2 IMPLANT
NDL SAFETY ECLIPSE 18X1.5 (NEEDLE) ×2 IMPLANT
NEEDLE HYPO 18GX1.5 SHARP (NEEDLE) ×2
NS IRRIG 1000ML POUR BTL (IV SOLUTION) ×2 IMPLANT
PACK TOTAL JOINT (CUSTOM PROCEDURE TRAY) ×2 IMPLANT
STRIP CLOSURE SKIN 1/2X4 (GAUZE/BANDAGES/DRESSINGS) ×2 IMPLANT
SUT ETHIBOND NAB CT1 #1 30IN (SUTURE) ×2 IMPLANT
SUT MNCRL AB 4-0 PS2 18 (SUTURE) ×2 IMPLANT
SUT VIC AB 2-0 CT1 27 (SUTURE) ×3
SUT VIC AB 2-0 CT1 TAPERPNT 27 (SUTURE) ×3 IMPLANT
SUT VLOC 180 0 24IN GS25 (SUTURE) ×2 IMPLANT
SYR 20CC LL (SYRINGE) ×2 IMPLANT
SYR 50ML LL SCALE MARK (SYRINGE) ×2 IMPLANT
TOWEL OR 17X26 10 PK STRL BLUE (TOWEL DISPOSABLE) ×2 IMPLANT
TRAY FOLEY CATH 14FRSI W/METER (CATHETERS) ×2 IMPLANT
WATER STERILE IRR 1500ML POUR (IV SOLUTION) ×2 IMPLANT

## 2014-09-08 NOTE — Anesthesia Procedure Notes (Signed)
Spinal Patient location during procedure: OR Start time: 09/08/2014 8:30 AM End time: 09/08/2014 8:35 AM Staffing Anesthesiologist: Milana Obey Resident/CRNA: Enrigue Catena E Performed by: anesthesiologist  Preanesthetic Checklist Completed: patient identified, site marked, surgical consent, pre-op evaluation, timeout performed, IV checked, risks and benefits discussed and monitors and equipment checked Spinal Block Patient position: sitting Prep: Betadine Patient monitoring: heart rate, continuous pulse ox and blood pressure Approach: midline Location: L3-4 Injection technique: single-shot Needle Needle type: Spinocan  Needle gauge: 22 G Needle length: 9 cm Assessment Sensory level: T8 Additional Notes Expiration date of kit checked and confirmed. Patient tolerated procedure well, without complications.

## 2014-09-08 NOTE — Transfer of Care (Signed)
Immediate Anesthesia Transfer of Care Note  Patient: Kevin Lucero  Procedure(s) Performed: Procedure(s): LEFT TOTAL HIP ARTHROPLASTY ANTERIOR APPROACH (Left)  Patient Location: PACU  Anesthesia Type:Spinal  Level of Consciousness: awake, alert , oriented and patient cooperative  Airway & Oxygen Therapy: Patient Spontanous Breathing and Patient connected to face mask oxygen  Post-op Assessment: Report given to PACU RN and Post -op Vital signs reviewed and stable  Post vital signs: stable  Complications: No apparent anesthesia complications  T12 spinal level

## 2014-09-08 NOTE — Evaluation (Signed)
Physical Therapy Evaluation Patient Details Name: Kevin Lucero MRN: [id number] DOB: 1956-02-05 Today's Date: 09/08/2014   History of Present Illness  L THR  Clinical Impression  Pt s/p L THR presents with decreased L LE strength/ROM and post op pain limiting functional mobility.  Pt should progress well to d/c home with family assist and HHPT follow up.    Follow Up Recommendations Home health PT    Equipment Recommendations  None recommended by PT    Recommendations for Other Services OT consult     Precautions / Restrictions Precautions Precautions: Fall Restrictions Weight Bearing Restrictions: No Other Position/Activity Restrictions: WBAT      Mobility  Bed Mobility Overal bed mobility: Needs Assistance Bed Mobility: Supine to Sit;Sit to Supine     Supine to sit: Min assist Sit to supine: Min assist   General bed mobility comments: cues for sequence and use of R LE to self assist  Transfers Overall transfer level: Needs assistance Equipment used: Rolling walker (2 wheeled) Transfers: Sit to/from Stand Sit to Stand: Min assist         General transfer comment: cues for LE management and use of UEs to self assist  Ambulation/Gait Ambulation/Gait assistance: Min assist Ambulation Distance (Feet): 42 Feet Assistive device: Rolling walker (2 wheeled) Gait Pattern/deviations: Step-to pattern;Decreased step length - right;Decreased step length - left;Shuffle;Antalgic;Trunk flexed Gait velocity: decr   General Gait Details: cues for sequence, posture and position from [employer]  Stairs            Wheelchair Mobility    Modified Rankin (Stroke Patients Only)       Balance                                             Pertinent Vitals/Pain Pain Assessment: 0-10 Pain Score: 6  Pain Location: L hip Pain Descriptors / Indicators: Aching;Burning Pain Intervention(s): Limited activity within patient's tolerance;Monitored during  session;Premedicated before session;Ice applied    Home Living Family/patient expects to be discharged to:: Private residence Living Arrangements: Spouse/significant other Available Help at Discharge: Family Type of Home: House Home Access: Stairs to enter Entrance Stairs-Rails: Doctor, general practiceight;Left Entrance Stairs-Number of Steps: 2+1 Home Layout: Able to live on main level with bedroom/bathroom;Two level Home Equipment: Walker - 2 wheels;Bedside commode;Crutches      Prior Function Level of Independence: Independent               Hand Dominance   Dominant Hand: Right    Extremity/Trunk Assessment   Upper Extremity Assessment: Overall WFL for tasks assessed           Lower Extremity Assessment: LLE deficits/detail   LLE Deficits / Details: Hip strength 2+/5 with AAROM at hip to 90 flex and 15 abd  Cervical / Trunk Assessment: Normal  Communication   Communication: No difficulties  Cognition Arousal/Alertness: Awake/alert Behavior During Therapy: WFL for tasks assessed/performed Overall Cognitive Status: Within Functional Limits for tasks assessed                      General Comments      Exercises Total Joint Exercises Ankle Circles/Pumps: AROM;Both;15 reps;Supine Quad Sets: AROM;Both;10 reps;Supine Heel Slides: AAROM;15 reps;Supine;Left Hip ABduction/ADduction: AAROM;Left;10 reps;Supine      Assessment/Plan    PT Assessment Patient needs continued PT services  PT Diagnosis Difficulty walking   PT Problem List  Decreased strength;Decreased range of motion;Decreased activity tolerance;Decreased mobility;Pain;Decreased knowledge of use of DME  PT Treatment Interventions DME instruction;Gait training;Stair training;Functional mobility training;Therapeutic activities;Therapeutic exercise;Patient/family education   PT Goals (Current goals can be found in the Care Plan section) Acute Rehab PT Goals Patient Stated Goal: Resume previous lifestyle with  decreased pain PT Goal Formulation: With patient Time For Goal Achievement: 09/13/14 Potential to Achieve Goals: Good    Frequency 7X/week   Barriers to discharge        Co-evaluation               End of Session Equipment Utilized During Treatment: Gait belt Activity Tolerance: Patient tolerated treatment well Patient left: in bed;with call bell/phone within reach;with family/visitor present Nurse Communication: Mobility status         Time: [id number] PT Time Calculation (min) (ACUTE ONLY): 34 min   Charges:   PT Evaluation $Initial PT Evaluation Tier I: 1 Procedure PT Treatments $Gait Training: 8-22 mins $Therapeutic Exercise: 8-22 mins   PT G Codes:          Evonne Rinks 09/08/2014, 6:00 PM

## 2014-09-08 NOTE — Op Note (Signed)
OPERATIVE REPORT  PREOPERATIVE DIAGNOSIS: Osteoarthritis of the Left hip.   POSTOPERATIVE DIAGNOSIS: Osteoarthritis of the Left  hip.   PROCEDURE: Left total hip arthroplasty, anterior approach.   SURGEON: Ollen GrossFrank Dajanique Robley, MD   ASSISTANT: [name], PA-C  ANESTHESIA:  Spinal  ESTIMATED BLOOD LOSS:-400 ml  DRAINS: Hemovac x1.   COMPLICATIONS: None   CONDITION: PACU - hemodynamically stable.   BRIEF CLINICAL NOTE: Kevin Lucero is a 58 y.o. male who has advanced end-  stage arthritis of his Left  hip with progressively worsening pain and  dysfunction.The patient has failed nonoperative management and presents for  total hip arthroplasty.   PROCEDURE IN DETAIL: After successful administration of spinal  anesthetic, the traction boots for [facility] bed were placed on both  feet and the patient was [facility] bed, boots placed into the leg  holders. The Left hip was then isolated from the perineum with plastic  drapes and prepped and draped in the usual sterile fashion. ASIS and  greater trochanter were marked and a oblique incision was made, starting  at about 1 cm lateral and 2 cm distal to the ASIS and coursing towards  the anterior cortex of the femur. The skin was cut with a 10 blade  through subcutaneous tissue to the level of the fascia overlying the  tensor fascia lata muscle. The fascia was then incised in line with the  incision at the junction of the anterior third and posterior 2/3rd. The  muscle was teased off the fascia and then the interval between the TFL  and the rectus was developed. The Hohmann retractor was then placed at  the top of the femoral neck over the capsule. The vessels overlying the  capsule were cauterized and the fat on top of the capsule was removed.  A Hohmann retractor was then placed anterior underneath the rectus  femoris to give exposure to the entire anterior capsule. A T-shaped  capsulotomy was performed. The  edges were tagged and the femoral head  was identified.       Osteophytes are removed off the superior acetabulum.  The femoral neck was then cut in situ with an oscillating saw. Traction  was then applied to the left lower extremity utilizing the [facility]  traction. The femoral head was then removed. Retractors were placed  around the acetabulum and then circumferential removal of the labrum was  performed. Osteophytes were also removed. Reaming starts at 47 mm to  medialize and  Increased in 2 mm increments to 51 mm. We reamed in  approximately 40 degrees of abduction, 20 degrees anteversion. A 52 mm  pinnacle acetabular shell was then impacted in anatomic position under  fluoroscopic guidance with excellent purchase. We did not need to place  any additional dome screws. A 32 mm neutral + 4 marathon liner was then  placed into the acetabular shell.       The femoral lift was then placed along the lateral aspect of the femur  just distal to the vastus ridge. The leg was  externally rotated and capsule  was stripped off the inferior aspect of the femoral neck down to the  level of the lesser trochanter, this was done with electrocautery. The femur was lifted after this was performed. The  leg was then placed and extended in adducted position to essentially delivering the femur. We also removed the capsule superiorly and the  piriformis from the piriformis fossa  to gain excellent exposure of the  proximal femur. Rongeur was used to remove some cancellous bone to get  into the lateral portion of the proximal femur for placement of the  initial starter reamer. The starter broaches was placed  the starter broach  and was shown to go down the center of the canal. Broaching  with the  Corail system was then performed starting at size 8, coursing  Up to size 11. A size 11 had excellent torsional and rotational  and axial stability. The trial standard offset neck was then placed  with a 32 + 5 trial  head. The hip was then reduced. We confirmed that  the stem was in the canal both on AP and lateral x-rays. It also has excellent sizing. The hip was reduced with outstanding stability through full extension, full external rotation,  and then flexion in adduction internal rotation. AP pelvis was taken  and the leg lengths were measured and found to be exactly equal. Hip  was then dislocated again and the femoral head and neck removed. The  femoral broach was removed. Size 11 Corail stem with a standard offset  neck was then impacted into the femur following native anteversion. Has  excellent purchase in the canal. Excellent torsional and rotational and  axial stability. It is confirmed to be in the canal on AP and lateral  fluoroscopic views. The 32 + 5 ceramic head was placed and the hip  reduced with outstanding stability. Again AP pelvis was taken and it  confirmed that the leg lengths were equal. The wound was then copiously  irrigated with saline solution and the capsule reattached and repaired  with Ethibond suture.  20 mL of Exparel mixed with 50 mL of saline then additional 20 ml of .25% Bupivicaine injected into the capsule and into the edge of the tensor fascia lata as well as subcutaneous tissue. The fascia overlying the tensor fascia lata was  then closed with a running #1 V-Loc. Subcu was closed with interrupted  2-0 Vicryl and subcuticular running 4-0 Monocryl. Incision was cleaned  and dried. Steri-Strips and a bulky sterile dressing applied. Hemovac  drain was hooked to suction and then he was awakened and transported to  recovery in stable condition.        Please note that a surgical assistant was a medical necessity for this procedure to perform it in a safe and expeditious manner. Assistant was necessary to provide appropriate retraction of vital neurovascular structures and to prevent femoral fracture and allow for anatomic placement of the prosthesis.  Ollen GrossFrank Jordie Skalsky, M.D.

## 2014-09-08 NOTE — Anesthesia Postprocedure Evaluation (Signed)
  Anesthesia Post-op Note  Patient: Kevin Lucero  Procedure(s) Performed: Procedure(s) (LRB): LEFT TOTAL HIP ARTHROPLASTY ANTERIOR APPROACH (Left)  Patient Location: PACU  Anesthesia Type: Spinal  Level of Consciousness: awake and alert   Airway and Oxygen Therapy: Patient Spontanous Breathing  Post-op Pain: mild  Post-op Assessment: Post-op Vital signs reviewed, Patient's Cardiovascular Status Stable, Respiratory Function Stable, Patent Airway and No signs of Nausea or vomiting  Last Vitals:  Filed Vitals:   09/08/14 1245  BP: 126/62  Pulse: 60  Temp:   Resp: 22    Post-op Vital Signs: stable   Complications: No apparent anesthesia complications

## 2014-09-08 NOTE — Interval H&P Note (Signed)
History and Physical Interval Note:  09/08/2014 7:52 AM  Kevin Lucero  has presented today for surgery, with the diagnosis of OSTEOARTHRITIS LEFT HIP  The various methods of treatment have been discussed with the patient and family. After consideration of risks, benefits and other options for treatment, the patient has consented to  Procedure(s): LEFT TOTAL HIP ARTHROPLASTY ANTERIOR APPROACH (Left) as a surgical intervention .  The patient's history has been reviewed, patient examined, no change in status, stable for surgery.  I have reviewed the patient's chart and labs.  Questions were answered to the patient's satisfaction.     Loanne DrillingALUISIO,Darrious Youman V

## 2014-09-08 NOTE — Plan of Care (Signed)
Problem: Consults Goal: Diagnosis- Total Joint Replacement Left anterior hip  Problem: Phase I Progression Outcomes Goal: CMS/Neurovascular status WDL Outcome: Completed/Met Date Met:  09/08/14 Goal: Pain controlled with appropriate interventions Outcome: Progressing Goal: Dangle or out of bed evening of surgery Outcome: Progressing Goal: Initial discharge plan identified Outcome: Completed/Met Date Met:  09/08/14 Goal: Hemodynamically stable Outcome: Completed/Met Date Met:  09/08/14

## 2014-09-09 LAB — BASIC METABOLIC PANEL
Anion gap: 10 (ref 5–15)
BUN: 18 mg/dL (ref 6–23)
CO2: 25 mEq/L (ref 19–32)
Calcium: 8.7 mg/dL (ref 8.4–10.5)
Chloride: 104 mEq/L (ref 96–112)
Creatinine, Ser: 1.18 mg/dL (ref 0.50–1.35)
GFR calc Af Amer: 77 mL/min — ABNORMAL LOW (ref >90)
GFR calc non Af Amer: 66 mL/min — ABNORMAL LOW (ref >90)
Glucose, Bld: 147 mg/dL — ABNORMAL HIGH (ref 70–99)
Potassium: 4.6 mEq/L (ref 3.7–5.3)
Sodium: 139 mEq/L (ref 137–147)

## 2014-09-09 LAB — CBC
HCT: 38.3 % — ABNORMAL LOW (ref 39.0–52.0)
Hemoglobin: 12.8 g/dL — ABNORMAL LOW (ref 13.0–17.0)
MCH: 30.8 pg (ref 26.0–34.0)
MCHC: 33.4 g/dL (ref 30.0–36.0)
MCV: 92.3 fL (ref 78.0–100.0)
Platelets: 262 10*3/uL (ref 150–400)
RBC: 4.15 MIL/uL — ABNORMAL LOW (ref 4.22–5.81)
RDW: 12.9 % (ref 11.5–15.5)
WBC: 12.5 10*3/uL — ABNORMAL HIGH (ref 4.0–10.5)

## 2014-09-09 MED ORDER — OXYCODONE HCL 5 MG PO TABS: 5.0000 mg | ORAL_TABLET | ORAL | Status: DC | PRN

## 2014-09-09 MED ORDER — RIVAROXABAN 10 MG PO TABS: 10.0000 mg | ORAL_TABLET | Freq: Every day | ORAL | Status: DC

## 2014-09-09 MED ORDER — METHOCARBAMOL 500 MG PO TABS: 500.0000 mg | ORAL_TABLET | Freq: Four times a day (QID) | ORAL | Status: DC | PRN

## 2014-09-09 MED ORDER — TRAMADOL HCL 50 MG PO TABS: 50.0000 mg | ORAL_TABLET | Freq: Four times a day (QID) | ORAL | Status: DC | PRN

## 2014-09-09 NOTE — Care Management Note (Signed)
    Page 1 of 1   09/09/2014     11:17:26 AM CARE MANAGEMENT NOTE 09/09/2014  Patient:  Kevin Lucero, Kevin Lucero   Account Number:  [id number]  Date Initiated:  09/09/2014  Documentation initiated by:  [facility]  Subjective/Objective Assessment:   adm: Left total hip arthroplasty, anterior approach.     Action/Plan:   discharge planning   Anticipated DC Date:  09/09/2014   Anticipated DC Plan:  [place]  CM consult      [facility] Choice  HOME HEALTH   Choice offered to / List presented to:  C-1 Patient      DME agency  NA     [place] arranged  NA      [place]   Status of service:  Completed, signed off Medicare Important Message given?   (If response is "NO", the following Medicare IM given date fields will be blank) Date Medicare IM given:   Medicare IM given by:   Date Additional Medicare IM given:   Additional Medicare IM given by:    Discharge Disposition:  [place]  Per UR Regulation:  Reviewed for med. necessity/level of care/duration of stay  If discussed at [place] of Stay Meetings, dates discussed:    Comments:  09/09/14 11:10 CM met with pt and wife, Butch Penny [phone] in room to offer choice of home health agency.  Pt chooses [facility] to render HHPT.  Address and contact information verified by pt.  No DME needed as pt has all from previous surgery.  Referral called to [facility].  No other CM needs were communicated.  Mariane Masters, BSN, CM [phone].

## 2014-09-09 NOTE — Progress Notes (Signed)
OT Cancellation Note  Patient Details Name: Kevin Lucero MRN: [id number] DOB: [birth year]   Cancelled Treatment:    Reason Eval/Treat Not Completed: OT screened, no needs identified, will sign off.  Pt had other hip done last year.  No OT needs at this time.  [name], OTR/L [phone] 09/09/2014  [name] 09/09/2014, 9:52 AM

## 2014-09-09 NOTE — Discharge Summary (Signed)
Physician Discharge Summary   Patient ID: Kevin Lucero MRN: [id number] DOB/AGE: 1956/10/15 58 y.o.  Admit date: 09/08/2014 Discharge date: 09-10-2014  Primary Diagnosis:  Osteoarthritis of the Left hip.  Admission Diagnoses:  Past Medical History  Diagnosis Date  . Hypertension   . Diabetes mellitus without complication     DIET CONTROL  . GERD (gastroesophageal reflux disease)     OCCAS -- WOULD USE OTC MED IF NEEDED  . Arthritis     OA BOTH HIPS -S/P RIGHT TOTAL HIP REPLACEMENT   Discharge Diagnoses:   Active Problems:   OA (osteoarthritis) of hip  Estimated body mass index is 33.65 kg/(m^2) as calculated from the following:   Height as of this encounter: 5' 9"  (1.753 m).   Weight as of this encounter: 103.42 kg (228 lb).  Procedure(s) (LRB): LEFT TOTAL HIP ARTHROPLASTY ANTERIOR APPROACH (Left)   Consults: None  HPI: Kevin Lucero is a 58 y.o. male who has advanced end-  stage arthritis of his Left hip with progressively worsening pain and  dysfunction.The patient has failed nonoperative management and presents for  total hip arthroplasty.   Laboratory Data: Admission on 09/08/2014  Component Date Value Ref Range Status  . ABO/RH(D) 09/08/2014 A POS   Final  . Antibody Screen 09/08/2014 NEG   Final  . Sample Expiration 09/08/2014 09/11/2014   Final  . Glucose-Capillary 09/08/2014 136* 70 - 99 mg/dL Final  . Glucose-Capillary 09/08/2014 122* 70 - 99 mg/dL Final  . Comment 1 09/08/2014 Documented in Chart   Final  . Comment 2 09/08/2014 Notify RN   Final  . WBC 09/09/2014 12.5* 4.0 - 10.5 K/uL Final  . RBC 09/09/2014 4.15* 4.22 - 5.81 MIL/uL Final  . Hemoglobin 09/09/2014 12.8* 13.0 - 17.0 g/dL Final  . HCT 09/09/2014 38.3* 39.0 - 52.0 % Final  . MCV 09/09/2014 92.3  78.0 - 100.0 fL Final  . MCH 09/09/2014 30.8  26.0 - 34.0 pg Final  . MCHC 09/09/2014 33.4  30.0 - 36.0 g/dL Final  . RDW 09/09/2014 12.9  11.5 - 15.5 % Final  . Platelets 09/09/2014 262   150 - 400 K/uL Final  . Sodium 09/09/2014 139  137 - 147 mEq/L Final  . Potassium 09/09/2014 4.6  3.7 - 5.3 mEq/L Final  . Chloride 09/09/2014 104  96 - 112 mEq/L Final  . CO2 09/09/2014 25  19 - 32 mEq/L Final  . Glucose, Bld 09/09/2014 147* 70 - 99 mg/dL Final  . BUN 09/09/2014 18  6 - 23 mg/dL Final  . Creatinine, Ser 09/09/2014 1.18  0.50 - 1.35 mg/dL Final  . Calcium 09/09/2014 8.7  8.4 - 10.5 mg/dL Final  . GFR calc non Af Amer 09/09/2014 66* >90 mL/min Final  . GFR calc Af Amer 09/09/2014 77* >90 mL/min Final   Comment: (NOTE) The eGFR has been calculated using the CKD EPI equation. This calculation has not been validated in all clinical situations. eGFR's persistently <90 mL/min signify possible Chronic Kidney Disease.   Georgiann Hahn gap 09/09/2014 10  5 - 15 Final  Hospital Outpatient Visit on 08/30/2014  Component Date Value Ref Range Status  . MRSA, PCR 08/30/2014 NEGATIVE  NEGATIVE Final  . Staphylococcus aureus 08/30/2014 POSITIVE* NEGATIVE Final   Comment:        The Xpert SA Assay (FDA approved for NASAL specimens in patients over 30 years of age), is one component of a comprehensive surveillance program.  Test performance has been validated by  [facility] for patients greater than or equal to 2 year old. It is not intended to diagnose infection nor to guide or monitor treatment.   Marland Kitchen aPTT 08/30/2014 32  24 - 37 seconds Final  . WBC 08/30/2014 7.1  4.0 - 10.5 K/uL Final  . RBC 08/30/2014 4.86  4.22 - 5.81 MIL/uL Final  . Hemoglobin 08/30/2014 15.4  13.0 - 17.0 g/dL Final  . HCT 08/30/2014 44.5  39.0 - 52.0 % Final  . MCV 08/30/2014 91.6  78.0 - 100.0 fL Final  . MCH 08/30/2014 31.7  26.0 - 34.0 pg Final  . MCHC 08/30/2014 34.6  30.0 - 36.0 g/dL Final  . RDW 08/30/2014 12.9  11.5 - 15.5 % Final  . Platelets 08/30/2014 299  150 - 400 K/uL Final  . Sodium 08/30/2014 137  137 - 147 mEq/L Final  . Potassium 08/30/2014 4.5  3.7 - 5.3 mEq/L Final  . Chloride  08/30/2014 100  96 - 112 mEq/L Final  . CO2 08/30/2014 26  19 - 32 mEq/L Final  . Glucose, Bld 08/30/2014 107* 70 - 99 mg/dL Final  . BUN 08/30/2014 16  6 - 23 mg/dL Final  . Creatinine, Ser 08/30/2014 1.25  0.50 - 1.35 mg/dL Final  . Calcium 08/30/2014 9.7  8.4 - 10.5 mg/dL Final  . Total Protein 08/30/2014 8.1  6.0 - 8.3 g/dL Final  . Albumin 08/30/2014 4.2  3.5 - 5.2 g/dL Final  . AST 08/30/2014 40* 0 - 37 U/L Final  . ALT 08/30/2014 33  0 - 53 U/L Final  . Alkaline Phosphatase 08/30/2014 47  39 - 117 U/L Final  . Total Bilirubin 08/30/2014 0.2* 0.3 - 1.2 mg/dL Final  . GFR calc non Af Amer 08/30/2014 62* >90 mL/min Final  . GFR calc Af Amer 08/30/2014 72* >90 mL/min Final   Comment: (NOTE) The eGFR has been calculated using the CKD EPI equation. This calculation has not been validated in all clinical situations. eGFR's persistently <90 mL/min signify possible Chronic Kidney Disease.   . Anion gap 08/30/2014 11  5 - 15 Final  . Prothrombin Time 08/30/2014 13.0  11.6 - 15.2 seconds Final  . INR 08/30/2014 0.97  0.00 - 1.49 Final  . Color, Urine 08/30/2014 YELLOW  YELLOW Final  . APPearance 08/30/2014 CLOUDY* CLEAR Final  . Specific Gravity, Urine 08/30/2014 1.019  1.005 - 1.030 Final  . pH 08/30/2014 7.5  5.0 - 8.0 Final  . Glucose, UA 08/30/2014 NEGATIVE  NEGATIVE mg/dL Final  . Hgb urine dipstick 08/30/2014 NEGATIVE  NEGATIVE Final  . Bilirubin Urine 08/30/2014 NEGATIVE  NEGATIVE Final  . Ketones, ur 08/30/2014 NEGATIVE  NEGATIVE mg/dL Final  . Protein, ur 08/30/2014 NEGATIVE  NEGATIVE mg/dL Final  . Urobilinogen, UA 08/30/2014 0.2  0.0 - 1.0 mg/dL Final  . Nitrite 08/30/2014 NEGATIVE  NEGATIVE Final  . Leukocytes, UA 08/30/2014 NEGATIVE  NEGATIVE Final   MICROSCOPIC NOT DONE ON URINES WITH NEGATIVE PROTEIN, BLOOD, LEUKOCYTES, NITRITE, OR GLUCOSE <1000 mg/dL.     X-Rays:Dg Chest 2 View  08/30/2014   CLINICAL DATA:  Preoperative radiograph. LEFT hip replacement preoperative  film. Initial encounter.  EXAM: CHEST  2 VIEW  COMPARISON:  12/17/2012.  FINDINGS: Cardiopericardial silhouette within normal limits. Mediastinal contours normal. Trachea midline. No airspace disease or effusion.  IMPRESSION: No active cardiopulmonary disease.   Electronically Signed   By: Dereck Ligas M.D.   On: 08/30/2014 16:03   Dg Hip Complete Left  08/30/2014  CLINICAL DATA:  Subsequent encounter for osteoarthritis of the left hip. Preoperative evaluation.  EXAM: LEFT HIP - COMPLETE 2+ VIEW  COMPARISON:  12/31/2012.  FINDINGS: Patient is status post right total hip replacement. Frontal view of the pelvis shows no fracture. SI joints and symphysis pubis are normal. AP and frog-leg lateral views of the left hip show loss of superior joint space. There is subchondral sclerosis with hypertrophic spurring in the femoral head and acetabular roof. No worrisome lytic or sclerotic osseous abnormality.  IMPRESSION: Left hip osteoarthritis.  Status post right total hip replacement.   Electronically Signed   By: Misty Stanley M.D.   On: 08/30/2014 16:04   Dg Pelvis Portable  09/08/2014   CLINICAL DATA:  Status post left total hip arthroplasty. Procedure performed today.  EXAM: PORTABLE PELVIS 1-2 VIEWS  COMPARISON:  12/31/2012  FINDINGS: Left hip prosthetic components are well-seated and well aligned on this single AP view. There is no acute fracture or evidence of an operative complication. Right hip prosthesis is stable from the prior study.  IMPRESSION: Well aligned left hip prosthesis.   Electronically Signed   By: Lajean Manes M.D.   On: 09/08/2014 10:46   Dg C-arm 1-60 Min-no Report  09/08/2014   CLINICAL DATA: hip pain   C-ARM 1-60 MINUTES  Fluoroscopy was utilized by the requesting physician.  No radiographic  interpretation.     EKG: Orders placed or performed during the hospital encounter of 08/30/14  . EKG 12-Lead  . EKG 12-Lead     Hospital Course: Patient was admitted to [facility] and taken to the OR and underwent the above state procedure without complications.  Patient tolerated the procedure well and was later transferred to the recovery room and then to the orthopaedic floor for postoperative care.  They were given PO and IV analgesics for pain control following their surgery.  They were given 24 hours of postoperative antibiotics of  Anti-infectives    Start     Dose/Rate Route Frequency Ordered Stop   09/08/14 2030  vancomycin (VANCOCIN) IVPB 1000 mg/200 mL premix     1,000 mg200 mL/hr over 60 Minutes Intravenous Every 12 hours 09/08/14 1252 09/08/14 2110   09/08/14 0600  vancomycin (VANCOCIN) 1,500 mg in sodium chloride 0.9 % 500 mL IVPB     1,500 mg250 mL/hr over 120 Minutes Intravenous On call to O.R. 09/07/14 1435 09/08/14 1022     and started on DVT prophylaxis in the form of Xarelto.   PT and OT were ordered for total hip protocol.  The patient was allowed to be WBAT with therapy. Discharge planning was consulted to help with postop disposition and equipment needs.  Patient had a good night on the evening of surgery.  They started to get up OOB with therapy on day one.  Hemovac drain was pulled without difficulty.  Continued to work with therapy into day two.  Dressing was changed on day two and the incision was healing well.   Patient was seen in rounds and was ready to go home on day two.  Discharge home with home health Diet - Cardiac diet and Diabetic diet Follow up - in 2 weeks Activity - WBAT Disposition - Home Condition Upon Discharge - Good D/C Meds - See DC Summary DVT Prophylaxis - Xarelto  Discharge Instructions    Call MD / Call 911    Complete by:  As directed   If you experience chest pain or shortness of breath,  CALL 911 and be transported to the hospital emergency room.  If you develope a fever above 101 F, pus (white drainage) or increased drainage or redness at the wound, or calf pain, call your surgeon's office.     Change  dressing    Complete by:  As directed   You may change your dressing dressing daily with sterile 4 x 4 inch gauze dressing and paper tape.  Do not submerge the incision under water.     Constipation Prevention    Complete by:  As directed   Drink plenty of fluids.  Prune juice may be helpful.  You may use a stool softener, such as Colace (over the counter) 100 mg twice a day.  Use MiraLax (over the counter) for constipation as needed.     Diet - low sodium heart healthy    Complete by:  As directed      Diet Carb Modified    Complete by:  As directed      Discharge instructions    Complete by:  As directed   Pick up stool softner and laxative for home. Do not submerge incision under water. May shower starting Saturday 09/11/2014 Continue to use ice for pain and swelling from surgery.  Total Hip Protocol.  Take Xarelto for two and a half more weeks, then discontinue Xarelto. Once the patient has completed the blood thinner regimen, then take a Baby 81 mg Aspirin daily for three more weeks.  Postoperative Constipation Protocol  Constipation - defined medically as fewer than three stools per week and severe constipation as less than one stool per week.  One of the most common issues patients have following surgery is constipation.  Even if you have a regular bowel pattern at home, your normal regimen is likely to be disrupted due to multiple reasons following surgery.  Combination of anesthesia, postoperative narcotics, change in appetite and fluid intake all can affect your bowels.  In order to avoid complications following surgery, here are some recommendations in order to help you during your recovery period.  Colace (docusate) - Pick up an over-the-counter form of Colace or another stool softener and take twice a day as long as you are requiring postoperative pain medications.  Take with a full glass of water daily.  If you experience loose stools or diarrhea, hold the colace until you  stool forms back up.  If your symptoms do not get better within 1 week or if they get worse, check with your doctor.  Dulcolax (bisacodyl) - Pick up over-the-counter and take as directed by the product packaging as needed to assist with the movement of your bowels.  Take with a full glass of water.  Use this product as needed if not relieved by Colace only.   MiraLax (polyethylene glycol) - Pick up over-the-counter to have on hand.  MiraLax is a solution that will increase the amount of water in your bowels to assist with bowel movements.  Take as directed and can mix with a glass of water, juice, soda, coffee, or tea.  Take if you go more than two days without a movement. Do not use MiraLax more than once per day. Call your doctor if you are still constipated or irregular after using this medication for 7 days in a row.  If you continue to have problems with postoperative constipation, please contact the office for further assistance and recommendations.  If you experience "the worst abdominal pain ever" or develop nausea or vomiting, please  contact the office immediatly for further recommendations for treatment.     Do not sit on low chairs, stoools or toilet seats, as it may be difficult to get up from low surfaces    Complete by:  As directed      Driving restrictions    Complete by:  As directed   No driving until released by the physician.     Increase activity slowly as tolerated    Complete by:  As directed      Lifting restrictions    Complete by:  As directed   No lifting until released by the physician.     Patient may shower    Complete by:  As directed   You may shower without a dressing once there is no drainage.  Do not wash over the wound.  If drainage remains, do not shower until drainage stops.     TED hose    Complete by:  As directed   Use stockings (TED hose) for 3 weeks on both leg(s).  You may remove them at night for sleeping.     Weight bearing as tolerated     Complete by:  As directed   Laterality:  left  Extremity:  Lower            Medication List    STOP taking these medications        glucosamine-chondroitin 500-400 MG tablet     multivitamin with minerals Tabs tablet      TAKE these medications        atorvastatin 40 MG tablet  Commonly known as:  LIPITOR  Take 40 mg by mouth every evening.     fenofibrate micronized 200 MG capsule  Commonly known as:  LOFIBRA  Take 200 mg by mouth every evening.     lisinopril 10 MG tablet  Commonly known as:  PRINIVIL,ZESTRIL  Take 10 mg by mouth daily before breakfast.     methocarbamol 500 MG tablet  Commonly known as:  ROBAXIN  Take 1 tablet (500 mg total) by mouth every 6 (six) hours as needed for muscle spasms.     metoprolol succinate 50 MG 24 hr tablet  Commonly known as:  TOPROL-XL  Take 75 mg by mouth daily before breakfast. Take with or immediately following a meal.     oxyCODONE 5 MG immediate release tablet  Commonly known as:  Oxy IR/ROXICODONE  Take 1-2 tablets (5-10 mg total) by mouth every 3 (three) hours as needed for moderate pain, severe pain or breakthrough pain.     rivaroxaban 10 MG Tabs tablet  Commonly known as:  XARELTO  - Take 1 tablet (10 mg total) by mouth daily with breakfast. Take Xarelto for two and a half more weeks, then discontinue Xarelto.  - Once the patient has completed the blood thinner regimen, then take a Baby 81 mg Aspirin daily for three more weeks.     traMADol 50 MG tablet  Commonly known as:  ULTRAM  Take 1-2 tablets (50-100 mg total) by mouth every 6 (six) hours as needed (mild pain).           Follow-up Information    Follow up with Gearlean Alf, MD. Schedule an appointment as soon as possible for a visit on 09/21/2014.   Specialty:  Orthopedic Surgery   Why:  Call office at [phone] for follow up appointment on Tuesday 09/21/2014   Contact information:   [place]  [id number]  [phone]       Signed: Arlee Muslim, PA-C Orthopaedic Surgery 09/09/2014, 9:03 AM

## 2014-09-09 NOTE — Plan of Care (Signed)
Problem: Phase II Progression Outcomes Goal: Ambulates Outcome: Completed/Met Date Met:  09/09/14 Goal: Tolerating diet Outcome: Completed/Met Date Met:  09/09/14 Goal: Discharge plan established Outcome: Completed/Met Date Met:  09/09/14

## 2014-09-09 NOTE — Progress Notes (Signed)
Physical Therapy Treatment Patient Details Name: Quentin Oreimmy L Shuford MRN: [id number] DOB: 05-29-1956 Today's Date: 09/09/2014    History of Present Illness L THR    PT Comments    POD # 1 am session.  Performed all supine THR TE's then assisted OOB to amb in hallway. Positioned in recliner and applied ICE to L hip.  Pt moving slowy and required increased time.   Follow Up Recommendations  Home health PT     Equipment Recommendations  None recommended by PT    Recommendations for Other Services       Precautions / Restrictions Precautions Precautions: None    Mobility  Bed Mobility Overal bed mobility: Needs Assistance Bed Mobility: Supine to Sit     Supine to sit: Min assist     General bed mobility comments: demonstrated and instructed how to use a belt to assist L LE off bed  Transfers Overall transfer level: Needs assistance Equipment used: Rolling walker (2 wheeled) Transfers: Sit to/from Stand Sit to Stand: Min guard;Min assist         General transfer comment: cues for LE management and use of UEs to self assist  Ambulation/Gait Ambulation/Gait assistance: Min assist;Min guard Ambulation Distance (Feet): 64 Feet Assistive device: Rolling walker (2 wheeled) Gait Pattern/deviations: Step-to pattern;Decreased step length - left;Decreased step length - right;Trunk flexed Gait velocity: decreased   General Gait Details: cues for sequence, posture and position from [employer]   Stairs            Wheelchair Mobility    Modified Rankin (Stroke Patients Only)       Balance                                    Cognition                            Exercises   Total Hip Replacement TE's 10 reps ankle pumps 10 reps knee presses 10 reps heel slides 10 reps SAQ's 10 reps ABD Followed by ICE     General Comments        Pertinent Vitals/Pain Pain Assessment: 0-10 Pain Score: 8  Pain Location: L hip Pain Descriptors /  Indicators: Aching;Constant;Sore;Tightness Pain Intervention(s): Monitored during session;Premedicated before session;Repositioned;Ice applied    Home Living                      Prior Function            PT Goals (current goals can now be found in the care plan section) Progress towards PT goals: Progressing toward goals    Frequency  7X/week    PT Plan      Co-evaluation             End of Session Equipment Utilized During Treatment: Gait belt Activity Tolerance: Patient tolerated treatment well Patient left: in chair;with call bell/phone within reach     Time: 1112-1155 PT Time Calculation (min) (ACUTE ONLY): 43 min  Charges:  $Gait Training: 8-22 mins $Therapeutic Exercise: 8-22 mins $Therapeutic Activity: 8-22 mins                    G Codes:      Felecia ShellingLori Katharine Rochefort  PTA [facility]  Acute  Rehab Pager      [phone]

## 2014-09-09 NOTE — Discharge Instructions (Addendum)
°Dr. Frank Aluisio °Total Joint Specialist °[facility] °[address] °[place], [place] [address] °[phone] ° ° ° °ANTERIOR APPROACH TOTAL HIP REPLACEMENT POSTOPERATIVE DIRECTIONS ° ° °Hip Rehabilitation, Guidelines Following Surgery  °The results of a hip operation are greatly improved after range of motion and muscle strengthening exercises. Follow all safety measures which are given to protect your hip. If any of these exercises cause increased pain or swelling in your joint, decrease the amount until you are comfortable again. Then slowly increase the exercises. Call your caregiver if you have problems or questions.  °HOME CARE INSTRUCTIONS  °Most of the following instructions are designed to prevent the dislocation of your new hip.  °Remove items at home which could result in a fall. This includes throw rugs or furniture in walking pathways.  °Continue medications as instructed at time of discharge. °· You may have some home medications which will be placed on hold until you complete the course of blood thinner medication. °· You may start showering once you are discharged home but do not submerge the incision under water. Just pat the incision dry and apply a dry gauze dressing on daily. °Do not put on socks or shoes without following the instructions of your caregivers.  °Sit on high chairs which makes it easier to stand.  °Sit on chairs with arms. Use the chair arms to help push yourself up when arising.  °Keep your leg on the side of the operation out in front of you when standing up.  °Arrange for the use of a toilet seat elevator so you are not sitting low.   °· Walk with walker as instructed.  °You may resume a sexual relationship in one month or when given the OK by your caregiver.  °Use walker as long as suggested by your caregivers.  °You may put full weight on your legs and walk as much as is comfortable. °Avoid periods of inactivity such as sitting longer than an hour  when not asleep. This helps prevent blood clots.  °You may return to work once you are cleared by your surgeon.  °Do not drive a car for 6 weeks or until released by your surgeon.  °Do not drive while taking narcotics.  °Wear elastic stockings for three weeks following surgery during the day but you may remove then at night.  °Make sure you keep all of your appointments after your operation with all of your doctors and caregivers. You should call the office at the above phone number and make an appointment for approximately two weeks after the date of your surgery. °Change the dressing daily and reapply a dry dressing each time. °Please pick up a stool softener and laxative for home use as long as you are requiring pain medications. °· ICE to the affected hip every three hours for 30 minutes at a time and then as needed for pain and swelling.  Continue to use ice on the hip for pain and swelling from surgery. You may notice swelling that will progress down to the foot and ankle.  This is normal after surgery.  Elevate the leg when you are not up walking on it.   °It is important for you to complete the blood thinner medication as prescribed by your doctor. °· Continue to use the breathing machine which will help keep your temperature down.  It is common for your temperature to cycle up and down following surgery, especially at night when you are not up moving around   and exerting yourself.  The breathing machine keeps your lungs expanded and your temperature down. ° °RANGE OF MOTION AND STRENGTHENING EXERCISES  °These exercises are designed to help you keep full movement of your hip joint. Follow your caregiver's or physical therapist's instructions. Perform all exercises about fifteen times, three times per day or as directed. Exercise both hips, even if you have had only one joint replacement. These exercises can be done on a training (exercise) mat, on the floor, on a table or on a bed. Use whatever works the best  and is most comfortable for you. Use music or television while you are exercising so that the exercises are a pleasant break in your day. This will make your life better with the exercises acting as a break in routine you can look forward to.  °Lying on your back, slowly slide your foot toward your buttocks, raising your knee up off the floor. Then slowly slide your foot back down until your leg is straight again.  °Lying on your back spread your legs as far apart as you can without causing discomfort.  °Lying on your side, raise your upper leg and foot straight up from the floor as far as is comfortable. Slowly lower the leg and repeat.  °Lying on your back, tighten up the muscle in the front of your thigh (quadriceps muscles). You can do this by keeping your leg straight and trying to raise your heel off the floor. This helps strengthen the largest muscle supporting your knee.  °Lying on your back, tighten up the muscles of your buttocks both with the legs straight and with the knee bent at a comfortable angle while keeping your heel on the floor.  ° °SKILLED REHAB INSTRUCTIONS: °If the patient is transferred to a skilled rehab facility following release from the hospital, a list of the current medications will be sent to the facility for the patient to continue.  When discharged from the skilled rehab facility, please have the facility set up the patient's Home Health Physical Therapy prior to being released. Also, the skilled facility will be responsible for providing the patient with their medications at time of release from the facility to include their pain medication, the muscle relaxants, and their blood thinner medication. If the patient is still at the rehab facility at time of the two week follow up appointment, the skilled rehab facility will also need to assist the patient in arranging follow up appointment in our office and any transportation needs. ° °MAKE SURE YOU:  °Understand these instructions.    °Will watch your condition.  °Will get help right away if you are not doing well or get worse. ° °Pick up stool softner and laxative for home. °Do not submerge incision under water. °May shower. °Continue to use ice for pain and swelling from surgery. °Total Hip Protocol. ° °Take Xarelto for two and a half more weeks, then discontinue Xarelto. °Once the patient has completed the blood thinner regimen, then take a Baby 81 mg Aspirin daily for three more weeks. ° ° ° °Postoperative Constipation Protocol ° °Constipation - defined medically as fewer than three stools per week and severe constipation as less than one stool per week. ° °One of the most common issues patients have following surgery is constipation.  Even if you have a regular bowel pattern at home, your normal regimen is likely to be disrupted due to multiple reasons following surgery.  Combination of anesthesia, postoperative narcotics, change in appetite and fluid   intake all can affect your bowels.  In order to avoid complications following surgery, here are some recommendations in order to help you during your recovery period. ° °Colace (docusate) - Pick up an over-the-counter form of Colace or another stool softener and take twice a day as long as you are requiring postoperative pain medications.  Take with a full glass of water daily.  If you experience loose stools or diarrhea, hold the colace until you stool forms back up.  If your symptoms do not get better within 1 week or if they get worse, check with your doctor. ° °Dulcolax (bisacodyl) - Pick up over-the-counter and take as directed by the product packaging as needed to assist with the movement of your bowels.  Take with a full glass of water.  Use this product as needed if not relieved by Colace only.  ° °MiraLax (polyethylene glycol) - Pick up over-the-counter to have on hand.  MiraLax is a solution that will increase the amount of water in your bowels to assist with bowel movements.  Take  as directed and can mix with a glass of water, juice, soda, coffee, or tea.  Take if you go more than two days without a movement. °Do not use MiraLax more than once per day. Call your doctor if you are still constipated or irregular after using this medication for 7 days in a row. ° °If you continue to have problems with postoperative constipation, please contact the office for further assistance and recommendations.  If you experience "the worst abdominal pain ever" or develop nausea or vomiting, please contact the office immediatly for further recommendations for treatment. ° ° °Information on my medicine - XARELTO® (Rivaroxaban) ° °This medication education was reviewed with me or my healthcare representative as part of my discharge preparation.  The pharmacist that spoke with me during my hospital stay was:  Legge, Justin Marshall, RPH ° °Why was Xarelto® prescribed for you? °Xarelto® was prescribed for you to reduce the risk of blood clots forming after orthopedic surgery. The medical term for these abnormal blood clots is venous thromboembolism (VTE). ° °What do you need to know about xarelto® ? °Take your Xarelto® ONCE DAILY at the same time every day. °You may take it either with or without food. ° °If you have difficulty swallowing the tablet whole, you may crush it and mix in applesauce just prior to taking your dose. ° °Take Xarelto® exactly as prescribed by your doctor and DO NOT stop taking Xarelto® without talking to the doctor who prescribed the medication.  Stopping without other VTE prevention medication to take the place of Xarelto® may increase your risk of developing a clot. ° °After discharge, you should have regular check-up appointments with your healthcare provider that is prescribing your Xarelto®.   ° °What do you do if you miss a dose? °If you miss a dose, take it as soon as you remember on the same day then continue your regularly scheduled once daily regimen the next day. Do not take  two doses of Xarelto® on the same day.  ° °Important Safety Information °A possible side effect of Xarelto® is bleeding. You should call your healthcare provider right away if you experience any of the following: °? Bleeding from an injury or your nose that does not stop. °? Unusual colored urine (red or dark brown) or unusual colored stools (red or black). °? Unusual bruising for unknown reasons. °? A serious fall or if you hit your head (even   if there is no bleeding). ° °Some medicines may interact with Xarelto® and might increase your risk of bleeding while on Xarelto®. To help avoid this, consult your healthcare provider or pharmacist prior to using any new prescription or non-prescription medications, including herbals, vitamins, non-steroidal anti-inflammatory drugs (NSAIDs) and supplements. ° °This website has more information on Xarelto®: www.xarelto.com. ° ° °

## 2014-09-09 NOTE — Plan of Care (Signed)
Problem: Phase I Progression Outcomes Goal: Pain controlled with appropriate interventions Outcome: Progressing Goal: Dangle or out of bed evening of surgery Outcome: Progressing  Problem: Phase II Progression Outcomes Goal: Tolerating diet Outcome: Progressing

## 2014-09-09 NOTE — Progress Notes (Signed)
Subjective: 1 Day Post-Op Procedure(s) (LRB): LEFT TOTAL HIP ARTHROPLASTY ANTERIOR APPROACH (Left) Patient reports pain as mild.   Patient seen in rounds with Dr. Wynelle Link. Patient is well, but has had some minor complaints of pain in the hip, requiring pain medications Patient did well yesterday with therapy following surgery.  We walked over 40 feet.  Will see how he does today with therapy.  If does well and meets all goals, then possibly home this afternoon.  If not, then probably tomorrow.  Objective: Vital signs in last 24 hours: Temp:  [96.6 F (35.9 C)-97.9 F (36.6 C)] 97.9 F (36.6 C) (11/19 0213) Pulse Rate:  [54-69] 64 (11/19 0213) Resp:  [16-22] 16 (11/19 0400) BP: (102-144)/(54-73) 117/61 mmHg (11/19 0213) SpO2:  [95 %-100 %] 98 % (11/19 0400) Weight:  [103.42 kg (228 lb)] 103.42 kg (228 lb) (11/18 1250)  Intake/Output from previous day:  Intake/Output Summary (Last 24 hours) at 09/09/14 0855 Last data filed at 09/09/14 0832  Gross per 24 hour  Intake   4860 ml  Output   2910 ml  Net   1950 ml    Intake/Output this shift: Total I/O In: 360 [P.O.:360] Out: -   Labs:  Recent Labs  09/09/14 0400  HGB 12.8*    Recent Labs  09/09/14 0400  WBC 12.5*  RBC 4.15*  HCT 38.3*  PLT 262    Recent Labs  09/09/14 0400  NA 139  K 4.6  CL 104  CO2 25  BUN 18  CREATININE 1.18  GLUCOSE 147*  CALCIUM 8.7   No results for input(s): LABPT, INR in the last 72 hours.  EXAM: General - Patient is Alert, Appropriate and Oriented Extremity - Neurovascular intact Sensation intact distally Dorsiflexion/Plantar flexion intact Dressing - clean, dry Motor Function - intact, moving foot and toes well on exam.   Assessment/Plan: 1 Day Post-Op Procedure(s) (LRB): LEFT TOTAL HIP ARTHROPLASTY ANTERIOR APPROACH (Left) Procedure(s) (LRB): LEFT TOTAL HIP ARTHROPLASTY ANTERIOR APPROACH (Left) Past Medical History  Diagnosis Date  . Hypertension   . Diabetes  mellitus without complication     DIET CONTROL  . GERD (gastroesophageal reflux disease)     OCCAS -- WOULD USE OTC MED IF NEEDED  . Arthritis     OA BOTH HIPS -S/P RIGHT TOTAL HIP REPLACEMENT   Active Problems:   OA (osteoarthritis) of hip  Estimated body mass index is 33.65 kg/(m^2) as calculated from the following:   Height as of this encounter: 5' 9" (1.753 m).   Weight as of this encounter: 103.42 kg (228 lb). Up with therapy Discharge home with home health when met goals Diet - Cardiac diet and Diabetic diet Follow up - in 2 weeks on Tuesday 09/21/2014 Activity - WBAT Disposition - Home Condition Upon Discharge - pending results with therapy D/C Meds - See DC Summary DVT Prophylaxis - Xarelto  Arlee Muslim, PA-C Orthopaedic Surgery 09/09/2014, 8:55 AM

## 2014-09-09 NOTE — Progress Notes (Signed)
Physical Therapy Treatment Patient Details Name: Quentin Oreimmy L Gaige MRN: [id number] DOB: 08/30/1956 Today's Date: 09/09/2014    History of Present Illness L THR    PT Comments    POD 3 1 pm session.  Performed all supine THR TE's then assisted OOB to perform all stand THR TE's following HEP handout.  Assisted in hallway then back to bed per pt request to rest.  Applied ICE.  Follow Up Recommendations  Home health PT     Equipment Recommendations  None recommended by PT    Recommendations for Other Services       Precautions / Restrictions Precautions Precautions: None    Mobility  Bed Mobility Overal bed mobility: Needs Assistance Bed Mobility: Supine to Sit     Supine to sit: Min assist     General bed mobility comments: demonstrated and instructed how to use a belt to assist L LE off bed  Transfers Overall transfer level: Needs assistance Equipment used: Rolling walker (2 wheeled) Transfers: Sit to/from Stand Sit to Stand: Min guard;Min assist         General transfer comment: cues for LE management and use of UEs to self assist  Ambulation/Gait Ambulation/Gait assistance: Min assist;Min guard Ambulation Distance (Feet): 64 Feet Assistive device: Rolling walker (2 wheeled) Gait Pattern/deviations: Step-to pattern;Decreased step length - left;Decreased step length - right;Trunk flexed Gait velocity: decreased   General Gait Details: cues for sequence, posture and position from [employer]   Stairs            Wheelchair Mobility    Modified Rankin (Stroke Patients Only)       Balance                                    Cognition                            Exercises   Total Hip Replacement TE's supine 10 reps ankle pumps 10 reps knee presses 10 reps heel slides 10 reps SAQ's 10 reps ABD standing 10 reps hip fle 10 reps hip ABD 10 reps hip ext and 10 reps knee flex Followed by ICE     General Comments         Pertinent Vitals/Pain Pain Assessment: 0-10 Pain Score: 8  Pain Location: L hip Pain Descriptors / Indicators: Aching;Constant;Sore;Tightness Pain Intervention(s): Monitored during session;Premedicated before session;Repositioned;Ice applied    Home Living                      Prior Function            PT Goals (current goals can now be found in the care plan section) Progress towards PT goals: Progressing toward goals    Frequency  7X/week    PT Plan      Co-evaluation             End of Session Equipment Utilized During Treatment: Gait belt Activity Tolerance: Patient tolerated treatment well Patient left: in chair;with call bell/phone within reach     Time: 1430-1510 PT Time Calculation (min) (ACUTE ONLY): 40 min  Charges:  $Gait Training: 8-22 mins $Therapeutic Exercise: 8-22 mins $Therapeutic Activity: 8-22 mins                    G Codes:      Felecia ShellingLori Taesha Goodell  PTA [facility]  Acute  Rehab Pager      [phone]

## 2014-09-10 LAB — BASIC METABOLIC PANEL
Anion gap: 9 (ref 5–15)
BUN: 19 mg/dL (ref 6–23)
CO2: 27 mEq/L (ref 19–32)
Calcium: 8.8 mg/dL (ref 8.4–10.5)
Chloride: 100 mEq/L (ref 96–112)
Creatinine, Ser: 1.21 mg/dL (ref 0.50–1.35)
GFR calc Af Amer: 75 mL/min — ABNORMAL LOW (ref >90)
GFR calc non Af Amer: 64 mL/min — ABNORMAL LOW (ref >90)
Glucose, Bld: 121 mg/dL — ABNORMAL HIGH (ref 70–99)
Potassium: 3.9 mEq/L (ref 3.7–5.3)
Sodium: 136 mEq/L — ABNORMAL LOW (ref 137–147)

## 2014-09-10 LAB — CBC
HCT: 37.3 % — ABNORMAL LOW (ref 39.0–52.0)
Hemoglobin: 12.2 g/dL — ABNORMAL LOW (ref 13.0–17.0)
MCH: 30.4 pg (ref 26.0–34.0)
MCHC: 32.7 g/dL (ref 30.0–36.0)
MCV: 93 fL (ref 78.0–100.0)
Platelets: 229 10*3/uL (ref 150–400)
RBC: 4.01 MIL/uL — ABNORMAL LOW (ref 4.22–5.81)
RDW: 13.1 % (ref 11.5–15.5)
WBC: 11.1 10*3/uL — ABNORMAL HIGH (ref 4.0–10.5)

## 2014-09-10 MED ORDER — TRAMADOL HCL 50 MG PO TABS: 50.0000 mg | ORAL_TABLET | Freq: Four times a day (QID) | ORAL | Status: DC | PRN

## 2014-09-10 MED ORDER — OXYCODONE HCL 5 MG PO TABS: 5.0000 mg | ORAL_TABLET | ORAL | Status: DC | PRN

## 2014-09-10 MED ORDER — RIVAROXABAN 10 MG PO TABS: 10.0000 mg | ORAL_TABLET | Freq: Every day | ORAL | Status: AC

## 2014-09-10 MED ORDER — METHOCARBAMOL 500 MG PO TABS: 500.0000 mg | ORAL_TABLET | Freq: Four times a day (QID) | ORAL | Status: AC | PRN

## 2014-09-10 NOTE — Progress Notes (Signed)
Discharge summary sent to payer through [facility]  

## 2014-09-10 NOTE — Progress Notes (Signed)
Physical Therapy Treatment Patient Details Name: Kevin Lucero MRN: [id number] DOB: 25-Aug-1956 Today's Date: 09/10/2014    History of Present Illness L  DA THR    PT Comments    Excellent progress  Follow Up Recommendations  Home health PT     Equipment Recommendations  None recommended by PT    Recommendations for Other Services       Precautions / Restrictions Precautions Precautions: None Restrictions Weight Bearing Restrictions: No Other Position/Activity Restrictions: WBAT    Mobility  Bed Mobility Overal bed mobility: Needs Assistance Bed Mobility: Supine to Sit     Supine to sit: Min assist     General bed mobility comments: assist with LLE  Transfers Overall transfer level: Needs assistance Equipment used: Rolling walker (2 wheeled) Transfers: Sit to/from Stand Sit to Stand: Supervision         General transfer comment: cues for LE management and use of UEs to self assist  Ambulation/Gait Ambulation/Gait assistance: Supervision;Modified independent (Device/Increase time) Ambulation Distance (Feet): 150 Feet Assistive device: Rolling walker (2 wheeled) Gait Pattern/deviations: Step-to pattern;Decreased step length - right;Decreased step length - left Gait velocity: decreased Gait velocity interpretation: Below normal speed for age/gender General Gait Details: cues for sequence, posture and position from RW   Stairs Stairs: Yes Stairs assistance: Min guard Stair Management: One rail Right;With walker;Step to pattern;Forwards Number of Stairs: 4 General stair comments: cues for sequence, pt aware of multiple techniques with stairs from last surgery  Wheelchair Mobility    Modified Rankin (Stroke Patients Only)       Balance                                    Cognition Arousal/Alertness: Awake/alert Behavior During Therapy: WFL for tasks assessed/performed Overall Cognitive Status: Within Functional Limits for  tasks assessed                      Exercises Total Joint Exercises Ankle Circles/Pumps: AROM;Both;15 reps Quad Sets: AROM;Strengthening;Both;10 reps Heel Slides: AAROM;Left;10 reps Hip ABduction/ADduction: AAROM;Strengthening;Left;10 reps    General Comments        Pertinent Vitals/Pain Pain Assessment: 0-10 Pain Score: 7  Pain Location: left hip Pain Descriptors / Indicators: Sore;Tightness Pain Intervention(s): Limited activity within patient's tolerance;RN gave pain meds during session;Ice applied    Home Living                      Prior Function            PT Goals (current goals can now be found in the care plan section) Acute Rehab PT Goals Patient Stated Goal: Resume previous lifestyle with decreased pain PT Goal Formulation: With patient Time For Goal Achievement: 09/13/14 Potential to Achieve Goals: Good Progress towards PT goals: Progressing toward goals    Frequency  7X/week    PT Plan Current plan remains appropriate    Co-evaluation             End of Session Equipment Utilized During Treatment: Gait belt Activity Tolerance: Patient tolerated treatment well Patient left: in chair;with call bell/phone within reach;with family/visitor present     Time: [id number] PT Time Calculation (min) (ACUTE ONLY): 30 min  Charges:  $Gait Training: 23-37 mins                    G Codes:  [facility]edoWILLIAMS,Deatra Mcmahen 09/10/2014, 9:45 AM

## 2014-09-10 NOTE — Plan of Care (Signed)
Problem: Phase II Progression Outcomes Goal: Other Phase II Outcomes/Goals Outcome: Completed/Met Date Met:  09/10/14

## 2014-09-10 NOTE — Progress Notes (Signed)
   Subjective: 2 Days Post-Op Procedure(s) (LRB): LEFT TOTAL HIP ARTHROPLASTY ANTERIOR APPROACH (Left) Patient reports pain as mild.   Patient seen in rounds with Dr. Lequita HaltAluisio. Patient is well, and has had no acute complaints or problems Patient is ready to go home  Objective: Vital signs in last 24 hours: Temp:  [97.6 F (36.4 C)-98.1 F (36.7 C)] 97.7 F (36.5 C) (11/20 0542) Pulse Rate:  [60-74] 67 (11/20 0542) Resp:  [15-16] 16 (11/20 0542) BP: (115-140)/(64-73) 115/69 mmHg (11/20 0542) SpO2:  [97 %-99 %] 98 % (11/20 0542)  Intake/Output from previous day:  Intake/Output Summary (Last 24 hours) at 09/10/14 0746 Last data filed at 09/10/14 0540  Gross per 24 hour  Intake   1080 ml  Output   1225 ml  Net   -145 ml    Intake/Output this shift:    Labs:  Recent Labs  09/09/14 0400 09/10/14 0517  HGB 12.8* 12.2*    Recent Labs  09/09/14 0400 09/10/14 0517  WBC 12.5* 11.1*  RBC 4.15* 4.01*  HCT 38.3* 37.3*  PLT 262 229    Recent Labs  09/09/14 0400 09/10/14 0517  NA 139 136*  K 4.6 3.9  CL 104 100  CO2 25 27  BUN 18 19  CREATININE 1.18 1.21  GLUCOSE 147* 121*  CALCIUM 8.7 8.8   No results for input(s): LABPT, INR in the last 72 hours.  EXAM: General - Patient is Alert, Appropriate and Oriented Extremity - Neurovascular intact Sensation intact distally Dorsiflexion/Plantar flexion intact Incision - clean, dry, no drainage, healing Motor Function - intact, moving foot and toes well on exam.   Assessment/Plan: 2 Days Post-Op Procedure(s) (LRB): LEFT TOTAL HIP ARTHROPLASTY ANTERIOR APPROACH (Left) Procedure(s) (LRB): LEFT TOTAL HIP ARTHROPLASTY ANTERIOR APPROACH (Left) Past Medical History  Diagnosis Date  . Hypertension   . Diabetes mellitus without complication     DIET CONTROL  . GERD (gastroesophageal reflux disease)     OCCAS -- WOULD USE OTC MED IF NEEDED  . Arthritis     OA BOTH HIPS -S/P RIGHT TOTAL HIP REPLACEMENT   Active  Problems:   OA (osteoarthritis) of hip  Estimated body mass index is 33.65 kg/(m^2) as calculated from the following:   Height as of this encounter: 5\' 9"  (1.753 m).   Weight as of this encounter: 103.42 kg (228 lb). Up with therapy Discharge home with home health Diet - Cardiac diet and Diabetic diet Follow up - in 2 weeks Activity - WBAT Disposition - Home Condition Upon Discharge - Good D/C Meds - See DC Summary DVT Prophylaxis - Xarelto  Kevin Peacerew Chasten Blaze, PA-C Orthopaedic Surgery 09/10/2014, 7:46 AM

## 2014-09-10 NOTE — Plan of Care (Signed)
Problem: Phase I Progression Outcomes Goal: Pain controlled with appropriate interventions Outcome: Completed/Met Date Met:  09/10/14 Goal: Dangle or out of bed evening of surgery Outcome: Completed/Met Date Met:  09/10/14 Goal: Other Phase I Outcomes/Goals Outcome: Completed/Met Date Met:  09/10/14

## 2015-03-21 IMAGING — DX DG PORTABLE PELVIS
1 series · 1 of 1 positions shown · non-contrast
Comparison: 12/31/2012

CLINICAL DATA: Status post left total hip arthroplasty. Procedure
performed today.

EXAM:
PORTABLE PELVIS 1-2 VIEWS

[pelvis ap]
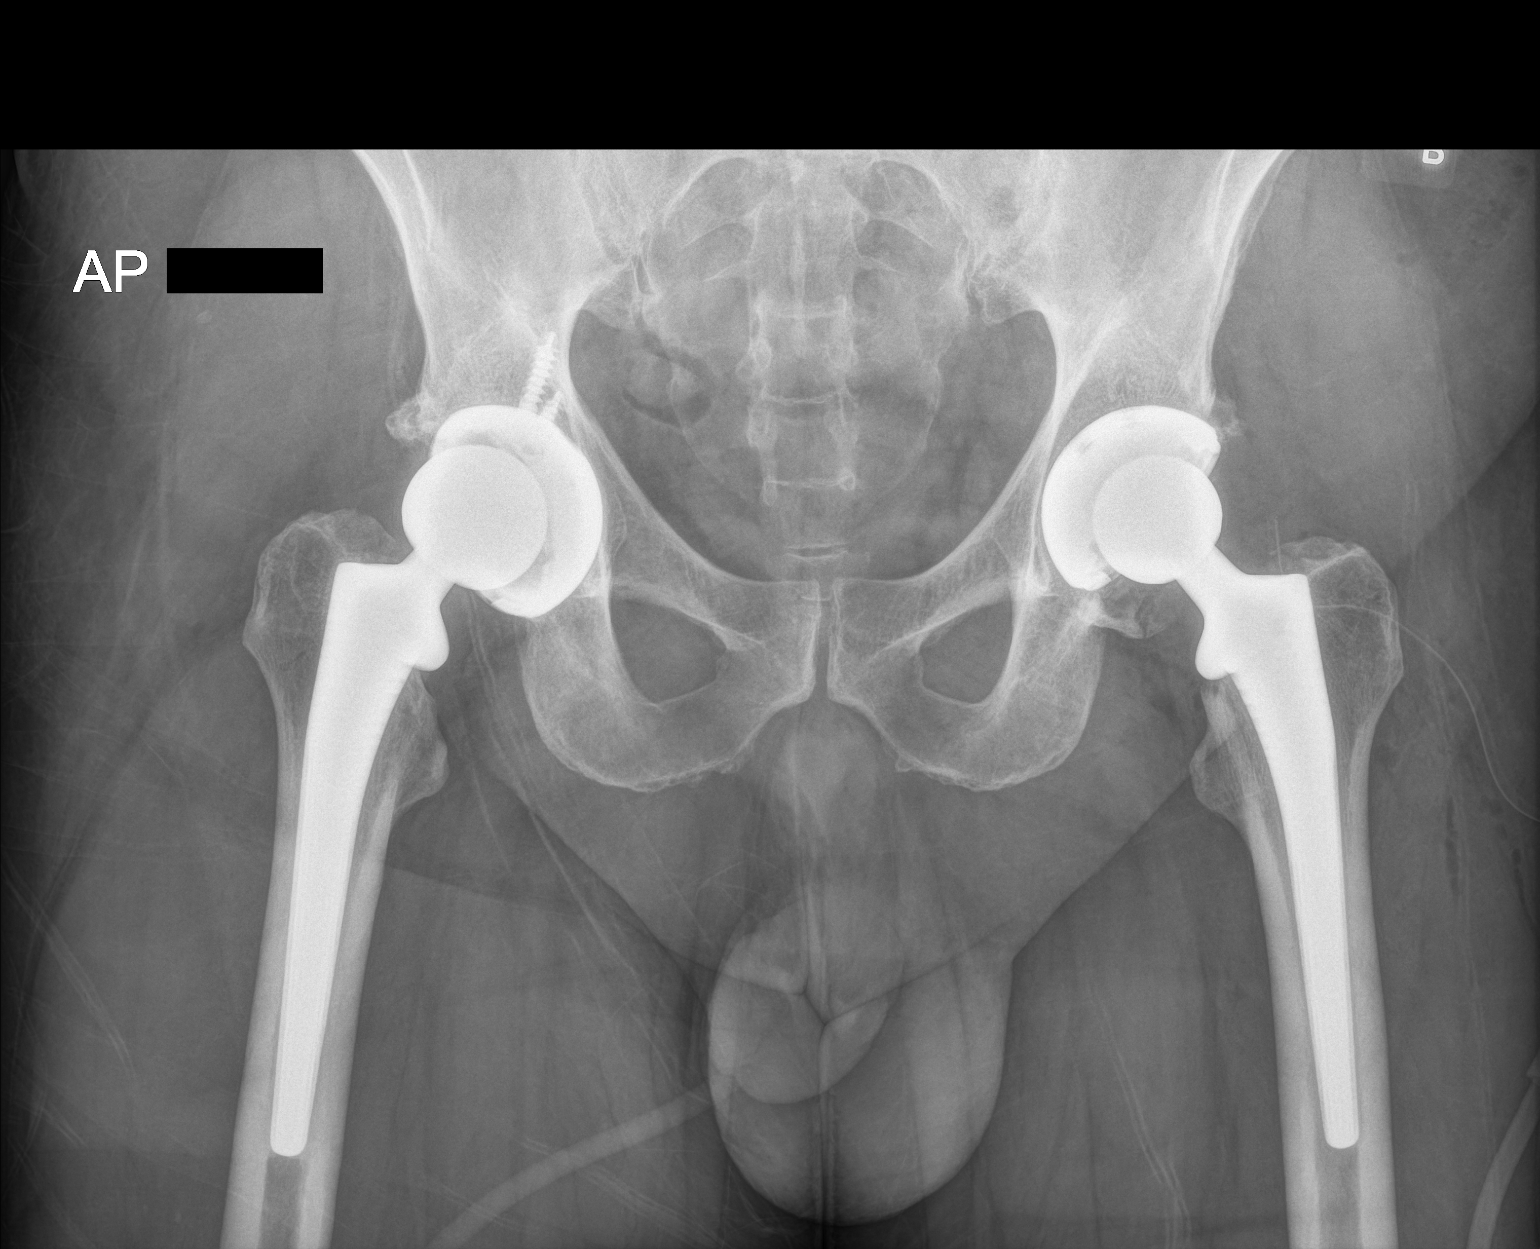

[1 of 1 positions shown; findings below may reference images not displayed]

FINDINGS: Left hip prosthetic components are well-seated and well aligned on
this single AP view. There is no acute fracture or evidence of an
operative complication. Right hip prosthesis is stable from the
prior study.
IMPRESSION: Well aligned left hip prosthesis.

## 2016-10-23 DIAGNOSIS — H35033 Hypertensive retinopathy, bilateral: Secondary | ICD-10-CM | POA: Diagnosis not present

## 2016-11-22 DIAGNOSIS — E1121 Type 2 diabetes mellitus with diabetic nephropathy: Secondary | ICD-10-CM | POA: Diagnosis not present

## 2016-11-23 DIAGNOSIS — E1121 Type 2 diabetes mellitus with diabetic nephropathy: Secondary | ICD-10-CM | POA: Diagnosis not present

## 2016-11-23 DIAGNOSIS — E782 Mixed hyperlipidemia: Secondary | ICD-10-CM | POA: Diagnosis not present

## 2016-11-23 DIAGNOSIS — N183 Chronic kidney disease, stage 3 (moderate): Secondary | ICD-10-CM | POA: Diagnosis not present

## 2016-11-23 DIAGNOSIS — Z23 Encounter for immunization: Secondary | ICD-10-CM | POA: Diagnosis not present

## 2016-11-23 DIAGNOSIS — I10 Essential (primary) hypertension: Secondary | ICD-10-CM | POA: Diagnosis not present

## 2017-02-16 DIAGNOSIS — M5489 Other dorsalgia: Secondary | ICD-10-CM | POA: Diagnosis not present

## 2017-02-16 DIAGNOSIS — R319 Hematuria, unspecified: Secondary | ICD-10-CM | POA: Diagnosis not present

## 2017-02-16 DIAGNOSIS — A084 Viral intestinal infection, unspecified: Secondary | ICD-10-CM | POA: Diagnosis not present

## 2017-02-18 DIAGNOSIS — R319 Hematuria, unspecified: Secondary | ICD-10-CM | POA: Diagnosis not present

## 2017-02-18 DIAGNOSIS — R109 Unspecified abdominal pain: Secondary | ICD-10-CM | POA: Diagnosis not present

## 2017-02-18 DIAGNOSIS — R112 Nausea with vomiting, unspecified: Secondary | ICD-10-CM | POA: Diagnosis not present

## 2017-02-18 DIAGNOSIS — R197 Diarrhea, unspecified: Secondary | ICD-10-CM | POA: Diagnosis not present

## 2017-05-03 DIAGNOSIS — D1801 Hemangioma of skin and subcutaneous tissue: Secondary | ICD-10-CM | POA: Diagnosis not present

## 2017-05-03 DIAGNOSIS — L570 Actinic keratosis: Secondary | ICD-10-CM | POA: Diagnosis not present

## 2017-05-03 DIAGNOSIS — L814 Other melanin hyperpigmentation: Secondary | ICD-10-CM | POA: Diagnosis not present

## 2017-05-03 DIAGNOSIS — L821 Other seborrheic keratosis: Secondary | ICD-10-CM | POA: Diagnosis not present

## 2017-05-03 DIAGNOSIS — L738 Other specified follicular disorders: Secondary | ICD-10-CM | POA: Diagnosis not present

## 2017-12-27 DIAGNOSIS — E1121 Type 2 diabetes mellitus with diabetic nephropathy: Secondary | ICD-10-CM | POA: Diagnosis not present

## 2017-12-27 DIAGNOSIS — E782 Mixed hyperlipidemia: Secondary | ICD-10-CM | POA: Diagnosis not present

## 2017-12-27 DIAGNOSIS — Z125 Encounter for screening for malignant neoplasm of prostate: Secondary | ICD-10-CM | POA: Diagnosis not present

## 2017-12-27 DIAGNOSIS — Z7984 Long term (current) use of oral hypoglycemic drugs: Secondary | ICD-10-CM | POA: Diagnosis not present

## 2017-12-27 DIAGNOSIS — I10 Essential (primary) hypertension: Secondary | ICD-10-CM | POA: Diagnosis not present

## 2018-01-08 DIAGNOSIS — I10 Essential (primary) hypertension: Secondary | ICD-10-CM | POA: Diagnosis not present

## 2018-01-08 DIAGNOSIS — Z0000 Encounter for general adult medical examination without abnormal findings: Secondary | ICD-10-CM | POA: Diagnosis not present

## 2018-01-08 DIAGNOSIS — E782 Mixed hyperlipidemia: Secondary | ICD-10-CM | POA: Diagnosis not present

## 2018-01-08 DIAGNOSIS — E119 Type 2 diabetes mellitus without complications: Secondary | ICD-10-CM | POA: Diagnosis not present

## 2018-01-08 DIAGNOSIS — E1121 Type 2 diabetes mellitus with diabetic nephropathy: Secondary | ICD-10-CM | POA: Diagnosis not present

## 2018-01-08 DIAGNOSIS — Z7984 Long term (current) use of oral hypoglycemic drugs: Secondary | ICD-10-CM | POA: Diagnosis not present

## 2018-01-08 DIAGNOSIS — Z1211 Encounter for screening for malignant neoplasm of colon: Secondary | ICD-10-CM | POA: Diagnosis not present

## 2018-01-08 DIAGNOSIS — H9190 Unspecified hearing loss, unspecified ear: Secondary | ICD-10-CM | POA: Diagnosis not present

## 2018-01-10 DIAGNOSIS — Z1211 Encounter for screening for malignant neoplasm of colon: Secondary | ICD-10-CM | POA: Diagnosis not present

## 2018-03-07 DIAGNOSIS — H903 Sensorineural hearing loss, bilateral: Secondary | ICD-10-CM | POA: Diagnosis not present

## 2018-07-04 DIAGNOSIS — L738 Other specified follicular disorders: Secondary | ICD-10-CM | POA: Diagnosis not present

## 2018-07-04 DIAGNOSIS — D225 Melanocytic nevi of trunk: Secondary | ICD-10-CM | POA: Diagnosis not present

## 2018-07-04 DIAGNOSIS — L821 Other seborrheic keratosis: Secondary | ICD-10-CM | POA: Diagnosis not present

## 2018-07-04 DIAGNOSIS — L570 Actinic keratosis: Secondary | ICD-10-CM | POA: Diagnosis not present

## 2018-07-04 DIAGNOSIS — D235 Other benign neoplasm of skin of trunk: Secondary | ICD-10-CM | POA: Diagnosis not present

## 2018-07-14 DIAGNOSIS — Z0000 Encounter for general adult medical examination without abnormal findings: Secondary | ICD-10-CM | POA: Diagnosis not present

## 2018-07-18 DIAGNOSIS — Z23 Encounter for immunization: Secondary | ICD-10-CM | POA: Diagnosis not present

## 2018-07-18 DIAGNOSIS — E782 Mixed hyperlipidemia: Secondary | ICD-10-CM | POA: Diagnosis not present

## 2018-07-18 DIAGNOSIS — N183 Chronic kidney disease, stage 3 (moderate): Secondary | ICD-10-CM | POA: Diagnosis not present

## 2018-07-18 DIAGNOSIS — I10 Essential (primary) hypertension: Secondary | ICD-10-CM | POA: Diagnosis not present

## 2018-07-18 DIAGNOSIS — E1121 Type 2 diabetes mellitus with diabetic nephropathy: Secondary | ICD-10-CM | POA: Diagnosis not present

## 2018-07-29 DIAGNOSIS — Z23 Encounter for immunization: Secondary | ICD-10-CM | POA: Diagnosis not present

## 2018-10-03 DIAGNOSIS — L821 Other seborrheic keratosis: Secondary | ICD-10-CM | POA: Diagnosis not present

## 2018-10-03 DIAGNOSIS — L738 Other specified follicular disorders: Secondary | ICD-10-CM | POA: Diagnosis not present

## 2018-10-03 DIAGNOSIS — B078 Other viral warts: Secondary | ICD-10-CM | POA: Diagnosis not present

## 2018-11-04 DIAGNOSIS — R05 Cough: Secondary | ICD-10-CM | POA: Diagnosis not present

## 2019-04-22 DIAGNOSIS — E1121 Type 2 diabetes mellitus with diabetic nephropathy: Secondary | ICD-10-CM | POA: Diagnosis not present

## 2019-04-22 DIAGNOSIS — Z125 Encounter for screening for malignant neoplasm of prostate: Secondary | ICD-10-CM | POA: Diagnosis not present

## 2019-04-22 DIAGNOSIS — E782 Mixed hyperlipidemia: Secondary | ICD-10-CM | POA: Diagnosis not present

## 2019-04-22 DIAGNOSIS — I10 Essential (primary) hypertension: Secondary | ICD-10-CM | POA: Diagnosis not present

## 2019-04-27 DIAGNOSIS — N183 Chronic kidney disease, stage 3 (moderate): Secondary | ICD-10-CM | POA: Diagnosis not present

## 2019-04-27 DIAGNOSIS — E782 Mixed hyperlipidemia: Secondary | ICD-10-CM | POA: Diagnosis not present

## 2019-04-27 DIAGNOSIS — I10 Essential (primary) hypertension: Secondary | ICD-10-CM | POA: Diagnosis not present

## 2019-04-27 DIAGNOSIS — E1121 Type 2 diabetes mellitus with diabetic nephropathy: Secondary | ICD-10-CM | POA: Diagnosis not present

## 2019-04-27 DIAGNOSIS — Z0001 Encounter for general adult medical examination with abnormal findings: Secondary | ICD-10-CM | POA: Diagnosis not present

## 2019-04-29 DIAGNOSIS — I10 Essential (primary) hypertension: Secondary | ICD-10-CM | POA: Diagnosis not present

## 2019-08-17 ENCOUNTER — Other Ambulatory Visit: Payer: Self-pay | Admitting: Physician Assistant

## 2019-08-17 DIAGNOSIS — R1032 Left lower quadrant pain: Secondary | ICD-10-CM | POA: Diagnosis not present

## 2019-08-17 DIAGNOSIS — N50812 Left testicular pain: Secondary | ICD-10-CM

## 2019-08-18 DIAGNOSIS — K4090 Unilateral inguinal hernia, without obstruction or gangrene, not specified as recurrent: Secondary | ICD-10-CM | POA: Diagnosis not present

## 2019-08-20 ENCOUNTER — Other Ambulatory Visit: Payer: Self-pay

## 2019-08-28 ENCOUNTER — Ambulatory Visit: Payer: Self-pay | Admitting: General Surgery

## 2019-08-28 DIAGNOSIS — K4090 Unilateral inguinal hernia, without obstruction or gangrene, not specified as recurrent: Secondary | ICD-10-CM | POA: Diagnosis not present

## 2019-08-28 NOTE — H&P (Signed)
History of Present Illness Kevin Lucero; 08/28/2019 10:43 AM) The patient is a 63 year old male who presents with an inguinal hernia. Referred by: Dr. Theadore Nan Chief Complaint: Left inguinal hernia  Patient is a 63 year old male with a history of hypertension, diabetes, hyperlipidemia, comes in with a left inguinal hernia. Patient states that over the last 2 weeks he noticed a increasing pain to the left inguinal area. Describes as burning. Patient does do some lifting at the [facility] as well as around the house. He states that he notices recent episode of pain initiated after doing some crunches. He states that thereafter some continued pain when he moves his leg and or gusty some heavy lifting. He states that he has tried heat is brought some relief. He tried ibuprofen, without any relief.  Patient a previous abdominal surgery.    Past Surgical History (Kevin Lucero, [place]; 08/28/2019 10:09 AM) Hip Surgery  Bilateral. Vasectomy   Diagnostic Studies History (Kevin Lucero, [place]; 08/28/2019 10:09 AM) Colonoscopy  1-5 years ago  Allergies (Kevin Lucero, [place]; 08/28/2019 10:11 AM) Penicillins  Allergies Reconciled   Medication History (Kevin Lucero, [place]; 08/28/2019 10:12 AM) Fenofibrate Micronized (200MG  Capsule, Oral) Active. Lisinopril (10MG  Tablet, Oral) Active. metFORMIN HCl ER (500MG  Tablet ER 24HR, Oral) Active. Metoprolol Succinate ER (50MG  Tablet ER 24HR, Oral) Active. Medications Reconciled  Social History (Kevin Lucero, [place]; 08/28/2019 10:09 AM) Alcohol use  Moderate alcohol use. Caffeine use  Carbonated beverages, Coffee, Tea. No drug use  Tobacco use  Never smoker.  Family History (Kevin Lucero, [place]; 08/28/2019 10:09 AM) Arthritis  Father. Diabetes Mellitus  Father, Mother. Heart Disease  Father. Hypertension  Father, Mother.  Other Problems (Kevin Lucero, [place]; 08/28/2019 10:09 AM) Arthritis  Diabetes Mellitus   Hemorrhoids  High blood pressure  Hypercholesterolemia     Review of Systems Kevin Lucero; 08/28/2019 10:41 AM) General Not Present- Appetite Loss, Chills, Fatigue, Fever, Night Sweats, Weight Gain and Weight Loss. Skin Not Present- Change in Wart/Mole, Dryness, Hives, Jaundice, New Lesions, Non-Healing Wounds, Rash and Ulcer. HEENT Not Present- Earache, Hearing Loss, Hoarseness, Nose Bleed, Oral Ulcers, Ringing in the Ears, Seasonal Allergies, Sinus Pain, Sore Throat, Visual Disturbances, Wears glasses/contact lenses and Yellow Eyes. Respiratory Not Present- Bloody sputum, Chronic Cough, Difficulty Breathing, Snoring and Wheezing. Breast Not Present- Breast Mass, Breast Pain, Nipple Discharge and Skin Changes. Cardiovascular Not Present- Chest Pain, Difficulty Breathing Lying Down, Leg Cramps, Palpitations, Rapid Heart Rate, Shortness of Breath and Swelling of Extremities. Gastrointestinal Not Present- Abdominal Pain, Bloating, Bloody Stool, Change in Bowel Habits, Chronic diarrhea, Constipation, Difficulty Swallowing, Excessive gas, Gets full quickly at meals, Hemorrhoids, Indigestion, Nausea, Rectal Pain and Vomiting. Male Genitourinary Not Present- Blood in Urine, Change in Urinary Stream, Frequency, Impotence, Nocturia, Painful Urination, Urgency and Urine Leakage. All other systems negative  Vitals (Kevin Lucero; 08/28/2019 10:11 AM) 08/28/2019 10:11 AM Weight: 233.4 lb Height: 69in Body Surface Area: 2.21 m Body Mass Index: 34.47 kg/m  Temp.: 97.33F  Pulse: 78 (Regular)  BP: 116/72 (Sitting, Left Arm, Standard)       Physical Exam Kevin Lucero; 08/28/2019 10:43 AM) The physical exam findings are as follows: Note:Constitutional: No acute distress, conversant, appears stated age  Eyes: Anicteric sclerae, moist conjunctiva, no lid lag  Neck: No thyromegaly, trachea midline, no cervical lymphadenopathy  Lungs: Clear to auscultation  biilaterally, normal respiratory effot  Cardiovascular: regular rate & rhythm, no murmurs, no peripheal edema, pedal pulses  2+  GI: Soft, no masses or hepatosplenomegaly, non-tender to palpation  MSK: Normal gait, no clubbing cyanosis, edema  Skin: No rashes, palpation reveals normal skin turgor  Psychiatric: Appropriate judgment and insight, oriented to person, place, and time  Abdomen Inspection Hernias - Left - Inguinal hernia - Reducible - Left.    Assessment & Plan Kevin Lucero; 08/28/2019 10:44 AM) LEFT INGUINAL HERNIA (K40.90) Impression: 64 year old male with a history of diabetes, hypertension, hyperlipidemia and a left inguinal hernia.  1. The patient will like to proceed to the operating room for laparoscopic left inguinal hernia repair with mesh.  2. I discussed with the patient the signs and symptoms of incarceration and strangulation and the need to proceed to the ER should they occur.  3. I discussed with the patient the risks and benefits of the procedure to include but not limited to: Infection, bleeding, damage to surrounding structures, possible need for further surgery, possible nerve pain, and possible recurrence. The patient was understanding and wishes to proceed.

## 2019-09-14 DIAGNOSIS — D225 Melanocytic nevi of trunk: Secondary | ICD-10-CM | POA: Diagnosis not present

## 2019-09-14 DIAGNOSIS — H35033 Hypertensive retinopathy, bilateral: Secondary | ICD-10-CM | POA: Diagnosis not present

## 2019-09-14 DIAGNOSIS — L819 Disorder of pigmentation, unspecified: Secondary | ICD-10-CM | POA: Diagnosis not present

## 2019-09-14 DIAGNOSIS — D235 Other benign neoplasm of skin of trunk: Secondary | ICD-10-CM | POA: Diagnosis not present

## 2019-09-14 DIAGNOSIS — L738 Other specified follicular disorders: Secondary | ICD-10-CM | POA: Diagnosis not present

## 2019-09-14 DIAGNOSIS — L570 Actinic keratosis: Secondary | ICD-10-CM | POA: Diagnosis not present

## 2019-09-20 DIAGNOSIS — Z20828 Contact with and (suspected) exposure to other viral communicable diseases: Secondary | ICD-10-CM | POA: Diagnosis not present

## 2019-10-09 DIAGNOSIS — Z01818 Encounter for other preprocedural examination: Secondary | ICD-10-CM | POA: Diagnosis not present

## 2019-10-13 DIAGNOSIS — K4090 Unilateral inguinal hernia, without obstruction or gangrene, not specified as recurrent: Secondary | ICD-10-CM | POA: Diagnosis not present

## 2019-10-26 DIAGNOSIS — E1121 Type 2 diabetes mellitus with diabetic nephropathy: Secondary | ICD-10-CM | POA: Diagnosis not present

## 2019-10-26 DIAGNOSIS — Z1211 Encounter for screening for malignant neoplasm of colon: Secondary | ICD-10-CM | POA: Diagnosis not present

## 2019-10-26 DIAGNOSIS — I10 Essential (primary) hypertension: Secondary | ICD-10-CM | POA: Diagnosis not present

## 2019-10-30 DIAGNOSIS — E1121 Type 2 diabetes mellitus with diabetic nephropathy: Secondary | ICD-10-CM | POA: Diagnosis not present

## 2019-10-30 DIAGNOSIS — N183 Chronic kidney disease, stage 3 unspecified: Secondary | ICD-10-CM | POA: Diagnosis not present

## 2019-10-30 DIAGNOSIS — I10 Essential (primary) hypertension: Secondary | ICD-10-CM | POA: Diagnosis not present

## 2019-10-30 DIAGNOSIS — E782 Mixed hyperlipidemia: Secondary | ICD-10-CM | POA: Diagnosis not present

## 2019-11-11 DIAGNOSIS — Z20828 Contact with and (suspected) exposure to other viral communicable diseases: Secondary | ICD-10-CM | POA: Diagnosis not present

## 2020-01-01 ENCOUNTER — Ambulatory Visit: Payer: BC Managed Care – PPO | Attending: Internal Medicine

## 2020-01-01 DIAGNOSIS — Z23 Encounter for immunization: Secondary | ICD-10-CM

## 2020-01-01 NOTE — Progress Notes (Signed)
   Covid-19 Vaccination Clinic  Name:  JAYAN RAYMUNDO    MRN: [id number] DOB: 1956/04/08  01/01/2020  Mr. Dimmick was observed post Covid-19 immunization for 15 minutes without incident. He was provided with Vaccine Information Sheet and instruction to access the V-Safe system.   Mr. Soto was instructed to call 911 with any severe reactions post vaccine: Marland Kitchen Difficulty breathing  . Swelling of face and throat  . A fast heartbeat  . A bad rash all over body  . Dizziness and weakness   Immunizations Administered    Name Date Dose VIS Date Route   Pfizer COVID-19 Vaccine 01/01/2020 10:05 AM 0.3 mL 10/02/2019 Intramuscular   Manufacturer: [employer], [employer]   Lot: [id number]   NDC: [address]-4

## 2020-01-25 ENCOUNTER — Ambulatory Visit: Payer: BC Managed Care – PPO | Attending: Internal Medicine

## 2020-01-25 DIAGNOSIS — Z23 Encounter for immunization: Secondary | ICD-10-CM

## 2020-01-25 NOTE — Progress Notes (Signed)
   Covid-19 Vaccination Clinic  Name:  WYLAN GENTZLER    MRN: [id number] DOB: Mar 01, 1956  01/25/2020  Mr. Covelli was observed post Covid-19 immunization for 15 minutes without incident. He was provided with Vaccine Information Sheet and instruction to access the V-Safe system.   Mr. Pokorski was instructed to call 911 with any severe reactions post vaccine: Marland Kitchen Difficulty breathing  . Swelling of face and throat  . A fast heartbeat  . A bad rash all over body  . Dizziness and weakness   Immunizations Administered    Name Date Dose VIS Date Route   Pfizer COVID-19 Vaccine 01/25/2020  1:57 PM 0.3 mL 10/02/2019 Intramuscular   Manufacturer: [employer], [employer]   Lot: [id number]   NDC: [address]-0

## 2020-04-27 DIAGNOSIS — E782 Mixed hyperlipidemia: Secondary | ICD-10-CM | POA: Diagnosis not present

## 2020-04-27 DIAGNOSIS — Z125 Encounter for screening for malignant neoplasm of prostate: Secondary | ICD-10-CM | POA: Diagnosis not present

## 2020-04-27 DIAGNOSIS — E1121 Type 2 diabetes mellitus with diabetic nephropathy: Secondary | ICD-10-CM | POA: Diagnosis not present

## 2020-05-06 DIAGNOSIS — E782 Mixed hyperlipidemia: Secondary | ICD-10-CM | POA: Diagnosis not present

## 2020-05-06 DIAGNOSIS — E1121 Type 2 diabetes mellitus with diabetic nephropathy: Secondary | ICD-10-CM | POA: Diagnosis not present

## 2020-05-06 DIAGNOSIS — I10 Essential (primary) hypertension: Secondary | ICD-10-CM | POA: Diagnosis not present

## 2020-05-06 DIAGNOSIS — N1831 Chronic kidney disease, stage 3a: Secondary | ICD-10-CM | POA: Diagnosis not present

## 2020-05-06 DIAGNOSIS — Z23 Encounter for immunization: Secondary | ICD-10-CM | POA: Diagnosis not present

## 2020-09-20 DIAGNOSIS — H35033 Hypertensive retinopathy, bilateral: Secondary | ICD-10-CM | POA: Diagnosis not present

## 2020-09-28 DIAGNOSIS — L570 Actinic keratosis: Secondary | ICD-10-CM | POA: Diagnosis not present

## 2020-09-28 DIAGNOSIS — L821 Other seborrheic keratosis: Secondary | ICD-10-CM | POA: Diagnosis not present

## 2020-09-28 DIAGNOSIS — D2262 Melanocytic nevi of left upper limb, including shoulder: Secondary | ICD-10-CM | POA: Diagnosis not present

## 2020-09-28 DIAGNOSIS — L738 Other specified follicular disorders: Secondary | ICD-10-CM | POA: Diagnosis not present

## 2020-09-28 DIAGNOSIS — D225 Melanocytic nevi of trunk: Secondary | ICD-10-CM | POA: Diagnosis not present

## 2020-10-25 DIAGNOSIS — E1121 Type 2 diabetes mellitus with diabetic nephropathy: Secondary | ICD-10-CM | POA: Diagnosis not present

## 2020-10-28 DIAGNOSIS — Z1211 Encounter for screening for malignant neoplasm of colon: Secondary | ICD-10-CM | POA: Diagnosis not present

## 2020-10-28 DIAGNOSIS — N401 Enlarged prostate with lower urinary tract symptoms: Secondary | ICD-10-CM | POA: Diagnosis not present

## 2020-10-28 DIAGNOSIS — I10 Essential (primary) hypertension: Secondary | ICD-10-CM | POA: Diagnosis not present

## 2020-10-28 DIAGNOSIS — E782 Mixed hyperlipidemia: Secondary | ICD-10-CM | POA: Diagnosis not present

## 2020-10-28 DIAGNOSIS — E1121 Type 2 diabetes mellitus with diabetic nephropathy: Secondary | ICD-10-CM | POA: Diagnosis not present

## 2020-10-28 DIAGNOSIS — Z0000 Encounter for general adult medical examination without abnormal findings: Secondary | ICD-10-CM | POA: Diagnosis not present

## 2020-10-28 DIAGNOSIS — Z23 Encounter for immunization: Secondary | ICD-10-CM | POA: Diagnosis not present

## 2021-01-26 DIAGNOSIS — Z23 Encounter for immunization: Secondary | ICD-10-CM | POA: Diagnosis not present

## 2021-05-19 DIAGNOSIS — I10 Essential (primary) hypertension: Secondary | ICD-10-CM | POA: Diagnosis not present

## 2021-05-19 DIAGNOSIS — Z125 Encounter for screening for malignant neoplasm of prostate: Secondary | ICD-10-CM | POA: Diagnosis not present

## 2021-05-19 DIAGNOSIS — E1121 Type 2 diabetes mellitus with diabetic nephropathy: Secondary | ICD-10-CM | POA: Diagnosis not present

## 2021-05-19 DIAGNOSIS — E782 Mixed hyperlipidemia: Secondary | ICD-10-CM | POA: Diagnosis not present

## 2021-05-25 DIAGNOSIS — E1121 Type 2 diabetes mellitus with diabetic nephropathy: Secondary | ICD-10-CM | POA: Diagnosis not present

## 2021-05-25 DIAGNOSIS — E782 Mixed hyperlipidemia: Secondary | ICD-10-CM | POA: Diagnosis not present

## 2021-05-25 DIAGNOSIS — N401 Enlarged prostate with lower urinary tract symptoms: Secondary | ICD-10-CM | POA: Diagnosis not present

## 2021-05-25 DIAGNOSIS — I10 Essential (primary) hypertension: Secondary | ICD-10-CM | POA: Diagnosis not present

## 2021-07-28 DIAGNOSIS — E782 Mixed hyperlipidemia: Secondary | ICD-10-CM | POA: Diagnosis not present

## 2021-10-17 DIAGNOSIS — H35033 Hypertensive retinopathy, bilateral: Secondary | ICD-10-CM | POA: Diagnosis not present

## 2021-10-24 DIAGNOSIS — L821 Other seborrheic keratosis: Secondary | ICD-10-CM | POA: Diagnosis not present

## 2021-10-24 DIAGNOSIS — D1801 Hemangioma of skin and subcutaneous tissue: Secondary | ICD-10-CM | POA: Diagnosis not present

## 2021-10-24 DIAGNOSIS — D0461 Carcinoma in situ of skin of right upper limb, including shoulder: Secondary | ICD-10-CM | POA: Diagnosis not present

## 2021-10-24 DIAGNOSIS — D225 Melanocytic nevi of trunk: Secondary | ICD-10-CM | POA: Diagnosis not present

## 2021-10-24 DIAGNOSIS — L738 Other specified follicular disorders: Secondary | ICD-10-CM | POA: Diagnosis not present

## 2021-11-03 DIAGNOSIS — E782 Mixed hyperlipidemia: Secondary | ICD-10-CM | POA: Diagnosis not present

## 2021-11-03 DIAGNOSIS — E1121 Type 2 diabetes mellitus with diabetic nephropathy: Secondary | ICD-10-CM | POA: Diagnosis not present

## 2021-11-10 DIAGNOSIS — I10 Essential (primary) hypertension: Secondary | ICD-10-CM | POA: Diagnosis not present

## 2021-11-10 DIAGNOSIS — Z0000 Encounter for general adult medical examination without abnormal findings: Secondary | ICD-10-CM | POA: Diagnosis not present

## 2021-11-10 DIAGNOSIS — E1121 Type 2 diabetes mellitus with diabetic nephropathy: Secondary | ICD-10-CM | POA: Diagnosis not present

## 2021-11-10 DIAGNOSIS — N401 Enlarged prostate with lower urinary tract symptoms: Secondary | ICD-10-CM | POA: Diagnosis not present

## 2021-11-10 DIAGNOSIS — Z23 Encounter for immunization: Secondary | ICD-10-CM | POA: Diagnosis not present

## 2021-11-10 DIAGNOSIS — E782 Mixed hyperlipidemia: Secondary | ICD-10-CM | POA: Diagnosis not present

## 2022-01-22 DIAGNOSIS — L570 Actinic keratosis: Secondary | ICD-10-CM | POA: Diagnosis not present

## 2022-01-22 DIAGNOSIS — L738 Other specified follicular disorders: Secondary | ICD-10-CM | POA: Diagnosis not present

## 2022-01-22 DIAGNOSIS — D1801 Hemangioma of skin and subcutaneous tissue: Secondary | ICD-10-CM | POA: Diagnosis not present

## 2022-01-22 DIAGNOSIS — Z85828 Personal history of other malignant neoplasm of skin: Secondary | ICD-10-CM | POA: Diagnosis not present

## 2022-01-22 DIAGNOSIS — L821 Other seborrheic keratosis: Secondary | ICD-10-CM | POA: Diagnosis not present

## 2022-04-27 DIAGNOSIS — R351 Nocturia: Secondary | ICD-10-CM | POA: Diagnosis not present

## 2022-04-27 DIAGNOSIS — N401 Enlarged prostate with lower urinary tract symptoms: Secondary | ICD-10-CM | POA: Diagnosis not present

## 2022-05-11 DIAGNOSIS — E1121 Type 2 diabetes mellitus with diabetic nephropathy: Secondary | ICD-10-CM | POA: Diagnosis not present

## 2022-05-11 DIAGNOSIS — Z125 Encounter for screening for malignant neoplasm of prostate: Secondary | ICD-10-CM | POA: Diagnosis not present

## 2022-05-17 DIAGNOSIS — R351 Nocturia: Secondary | ICD-10-CM | POA: Diagnosis not present

## 2022-05-17 DIAGNOSIS — E782 Mixed hyperlipidemia: Secondary | ICD-10-CM | POA: Diagnosis not present

## 2022-05-17 DIAGNOSIS — I10 Essential (primary) hypertension: Secondary | ICD-10-CM | POA: Diagnosis not present

## 2022-05-17 DIAGNOSIS — E1121 Type 2 diabetes mellitus with diabetic nephropathy: Secondary | ICD-10-CM | POA: Diagnosis not present

## 2022-05-25 DIAGNOSIS — N3281 Overactive bladder: Secondary | ICD-10-CM | POA: Diagnosis not present

## 2022-05-25 DIAGNOSIS — N401 Enlarged prostate with lower urinary tract symptoms: Secondary | ICD-10-CM | POA: Diagnosis not present

## 2022-05-25 DIAGNOSIS — R351 Nocturia: Secondary | ICD-10-CM | POA: Diagnosis not present

## 2022-06-19 DIAGNOSIS — K648 Other hemorrhoids: Secondary | ICD-10-CM | POA: Diagnosis not present

## 2022-06-19 DIAGNOSIS — K5730 Diverticulosis of large intestine without perforation or abscess without bleeding: Secondary | ICD-10-CM | POA: Diagnosis not present

## 2022-06-19 DIAGNOSIS — D125 Benign neoplasm of sigmoid colon: Secondary | ICD-10-CM | POA: Diagnosis not present

## 2022-06-19 DIAGNOSIS — Z1211 Encounter for screening for malignant neoplasm of colon: Secondary | ICD-10-CM | POA: Diagnosis not present

## 2022-08-06 DIAGNOSIS — L309 Dermatitis, unspecified: Secondary | ICD-10-CM | POA: Diagnosis not present

## 2022-08-06 DIAGNOSIS — L738 Other specified follicular disorders: Secondary | ICD-10-CM | POA: Diagnosis not present

## 2022-08-06 DIAGNOSIS — L821 Other seborrheic keratosis: Secondary | ICD-10-CM | POA: Diagnosis not present

## 2022-08-06 DIAGNOSIS — L570 Actinic keratosis: Secondary | ICD-10-CM | POA: Diagnosis not present

## 2022-10-29 DIAGNOSIS — H35033 Hypertensive retinopathy, bilateral: Secondary | ICD-10-CM | POA: Diagnosis not present

## 2022-11-21 ENCOUNTER — Encounter (HOSPITAL_BASED_OUTPATIENT_CLINIC_OR_DEPARTMENT_OTHER): Payer: Self-pay | Admitting: Emergency Medicine

## 2022-11-21 ENCOUNTER — Emergency Department (HOSPITAL_BASED_OUTPATIENT_CLINIC_OR_DEPARTMENT_OTHER)
Admission: EM | Admit: 2022-11-21 | Discharge: 2022-11-21 | Disposition: A | Discharge status: Home / Self Care | Payer: BC Managed Care – PPO | Attending: Emergency Medicine | Admitting: Emergency Medicine

## 2022-11-21 ENCOUNTER — Emergency Department (HOSPITAL_BASED_OUTPATIENT_CLINIC_OR_DEPARTMENT_OTHER): Payer: BC Managed Care – PPO

## 2022-11-21 ENCOUNTER — Other Ambulatory Visit: Payer: Self-pay

## 2022-11-21 DIAGNOSIS — S43202A Unspecified subluxation of left sternoclavicular joint, initial encounter: Secondary | ICD-10-CM | POA: Insufficient documentation

## 2022-11-21 DIAGNOSIS — Y92481 Parking lot as the place of occurrence of the external cause: Secondary | ICD-10-CM | POA: Diagnosis not present

## 2022-11-21 DIAGNOSIS — S0990XA Unspecified injury of head, initial encounter: Secondary | ICD-10-CM | POA: Diagnosis not present

## 2022-11-21 DIAGNOSIS — M25511 Pain in right shoulder: Secondary | ICD-10-CM | POA: Diagnosis not present

## 2022-11-21 DIAGNOSIS — S2329XA Dislocation of other parts of thorax, initial encounter: Secondary | ICD-10-CM

## 2022-11-21 DIAGNOSIS — R519 Headache, unspecified: Secondary | ICD-10-CM | POA: Diagnosis not present

## 2022-11-21 DIAGNOSIS — R911 Solitary pulmonary nodule: Secondary | ICD-10-CM | POA: Diagnosis not present

## 2022-11-21 DIAGNOSIS — T1490XA Injury, unspecified, initial encounter: Secondary | ICD-10-CM | POA: Diagnosis not present

## 2022-11-21 DIAGNOSIS — R0789 Other chest pain: Secondary | ICD-10-CM | POA: Diagnosis not present

## 2022-11-21 LAB — CBC
HCT: 44.4 % (ref 39.0–52.0)
Hemoglobin: 15.3 g/dL (ref 13.0–17.0)
MCH: 31.9 pg (ref 26.0–34.0)
MCHC: 34.5 g/dL (ref 30.0–36.0)
MCV: 92.5 fL (ref 80.0–100.0)
Platelets: 258 10*3/uL (ref 150–400)
RBC: 4.8 MIL/uL (ref 4.22–5.81)
RDW: 12.2 % (ref 11.5–15.5)
WBC: 7.6 10*3/uL (ref 4.0–10.5)
nRBC: 0 % (ref 0.0–0.2)

## 2022-11-21 LAB — COMPREHENSIVE METABOLIC PANEL
ALT: 29 U/L (ref 0–44)
AST: 26 U/L (ref 15–41)
Albumin: 4.5 g/dL (ref 3.5–5.0)
Alkaline Phosphatase: 51 U/L (ref 38–126)
Anion gap: 8 (ref 5–15)
BUN: 17 mg/dL (ref 8–23)
CO2: 28 mmol/L (ref 22–32)
Calcium: 9.7 mg/dL (ref 8.9–10.3)
Chloride: 102 mmol/L (ref 98–111)
Creatinine, Ser: 1.05 mg/dL (ref 0.61–1.24)
GFR, Estimated: >60 mL/min (ref >60)
Glucose, Bld: 81 mg/dL (ref 70–99)
Potassium: 4.3 mmol/L (ref 3.5–5.1)
Sodium: 138 mmol/L (ref 135–145)
Total Bilirubin: 0.7 mg/dL (ref 0.3–1.2)
Total Protein: 7.7 g/dL (ref 6.5–8.1)

## 2022-11-21 LAB — TROPONIN I (HIGH SENSITIVITY): Troponin I (High Sensitivity): 7 ng/L (ref <18)

## 2022-11-21 MED ORDER — IOHEXOL 300 MG/ML  SOLN
100.0000 mL | Freq: Once | INTRAMUSCULAR | Status: AC | PRN
  Administered 2022-11-21: 75 mL via INTRAVENOUS

## 2022-11-21 MED ORDER — HYDROCODONE-ACETAMINOPHEN 5-325 MG PO TABS: 1.0000 | ORAL_TABLET | Freq: Four times a day (QID) | ORAL | 0 refills | Status: AC | PRN

## 2022-11-21 MED ORDER — HYDROCODONE-ACETAMINOPHEN 5-325 MG PO TABS
1.0000 | ORAL_TABLET | Freq: Once | ORAL | Status: AC
  Administered 2022-11-21: 1 via ORAL
  Filled 2022-11-21: qty 1
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
## 2022-11-21 NOTE — Discharge Instructions (Addendum)
Your CT head and cervical spine were negative for evidence of acute traumatic injury.  Your CT of the chest revealed the following: IMPRESSION:  1. Subtle, nondisplaced fracture is suspected involving the anterior  aspect of the right first rib. Correlate for any focal tenderness  over the anterior right first rib.  2. Slight posterior subluxation of the body of sternum with respect  to the sternal manubrial joint on the order of 2 mm.  3. 5 mm right middle lobe lung nodule. Per Fleischner Society  Guidelines, no routine follow-up imaging is recommended.  4. Gallstone.   You have no tenderness over your sternum or the right side of your rib cage.  Low suspicion for spinal fracture or rib fracture however in the setting of your pain, will prescribe multimodal pain control.  Recommend Norco as needed, Tylenol ibuprofen for pain control.  Exercise your lungs with deep breathing over the next few days.  Follow-up with your PCP as needed.

## 2022-12-03 DIAGNOSIS — H35351 Cystoid macular degeneration, right eye: Secondary | ICD-10-CM | POA: Diagnosis not present

## 2022-12-24 DIAGNOSIS — E1121 Type 2 diabetes mellitus with diabetic nephropathy: Secondary | ICD-10-CM | POA: Diagnosis not present

## 2022-12-24 DIAGNOSIS — E782 Mixed hyperlipidemia: Secondary | ICD-10-CM | POA: Diagnosis not present

## 2022-12-24 DIAGNOSIS — I10 Essential (primary) hypertension: Secondary | ICD-10-CM | POA: Diagnosis not present

## 2022-12-26 DIAGNOSIS — H35033 Hypertensive retinopathy, bilateral: Secondary | ICD-10-CM | POA: Diagnosis not present

## 2022-12-26 DIAGNOSIS — I10 Essential (primary) hypertension: Secondary | ICD-10-CM | POA: Diagnosis not present

## 2022-12-26 DIAGNOSIS — E782 Mixed hyperlipidemia: Secondary | ICD-10-CM | POA: Diagnosis not present

## 2022-12-26 DIAGNOSIS — E1121 Type 2 diabetes mellitus with diabetic nephropathy: Secondary | ICD-10-CM | POA: Diagnosis not present

## 2022-12-26 DIAGNOSIS — Z0000 Encounter for general adult medical examination without abnormal findings: Secondary | ICD-10-CM | POA: Diagnosis not present

## 2023-04-18 DIAGNOSIS — D235 Other benign neoplasm of skin of trunk: Secondary | ICD-10-CM | POA: Diagnosis not present

## 2023-04-18 DIAGNOSIS — L245 Irritant contact dermatitis due to other chemical products: Secondary | ICD-10-CM | POA: Diagnosis not present

## 2023-04-18 DIAGNOSIS — L570 Actinic keratosis: Secondary | ICD-10-CM | POA: Diagnosis not present

## 2023-04-18 DIAGNOSIS — L738 Other specified follicular disorders: Secondary | ICD-10-CM | POA: Diagnosis not present

## 2023-04-18 DIAGNOSIS — L281 Prurigo nodularis: Secondary | ICD-10-CM | POA: Diagnosis not present

## 2023-05-08 ENCOUNTER — Other Ambulatory Visit: Payer: Self-pay | Admitting: Pain Medicine

## 2023-05-08 ENCOUNTER — Ambulatory Visit
Admission: RE | Admit: 2023-05-08 | Discharge: 2023-05-08 | Disposition: A | Discharge status: Home / Self Care | Payer: BC Managed Care – PPO | Source: Ambulatory Visit | Attending: Pain Medicine | Admitting: Pain Medicine

## 2023-05-08 DIAGNOSIS — M25511 Pain in right shoulder: Secondary | ICD-10-CM

## 2023-06-10 DIAGNOSIS — M25511 Pain in right shoulder: Secondary | ICD-10-CM | POA: Diagnosis not present

## 2023-06-28 DIAGNOSIS — E1121 Type 2 diabetes mellitus with diabetic nephropathy: Secondary | ICD-10-CM | POA: Diagnosis not present

## 2023-06-28 DIAGNOSIS — M25511 Pain in right shoulder: Secondary | ICD-10-CM | POA: Diagnosis not present

## 2023-07-08 DIAGNOSIS — E1121 Type 2 diabetes mellitus with diabetic nephropathy: Secondary | ICD-10-CM | POA: Diagnosis not present

## 2023-07-08 DIAGNOSIS — E782 Mixed hyperlipidemia: Secondary | ICD-10-CM | POA: Diagnosis not present

## 2023-07-08 DIAGNOSIS — H35033 Hypertensive retinopathy, bilateral: Secondary | ICD-10-CM | POA: Diagnosis not present

## 2023-07-08 DIAGNOSIS — Z23 Encounter for immunization: Secondary | ICD-10-CM | POA: Diagnosis not present

## 2023-07-08 DIAGNOSIS — I10 Essential (primary) hypertension: Secondary | ICD-10-CM | POA: Diagnosis not present

## 2023-09-17 DIAGNOSIS — H903 Sensorineural hearing loss, bilateral: Secondary | ICD-10-CM | POA: Diagnosis not present

## 2023-09-17 DIAGNOSIS — H9313 Tinnitus, bilateral: Secondary | ICD-10-CM | POA: Diagnosis not present

## 2023-09-23 DIAGNOSIS — J019 Acute sinusitis, unspecified: Secondary | ICD-10-CM | POA: Diagnosis not present

## 2023-09-23 DIAGNOSIS — H1033 Unspecified acute conjunctivitis, bilateral: Secondary | ICD-10-CM | POA: Diagnosis not present

## 2023-12-02 DIAGNOSIS — H35033 Hypertensive retinopathy, bilateral: Secondary | ICD-10-CM | POA: Diagnosis not present

## 2024-01-24 DIAGNOSIS — E1121 Type 2 diabetes mellitus with diabetic nephropathy: Secondary | ICD-10-CM | POA: Diagnosis not present

## 2024-01-24 DIAGNOSIS — Z125 Encounter for screening for malignant neoplasm of prostate: Secondary | ICD-10-CM | POA: Diagnosis not present

## 2024-01-24 DIAGNOSIS — E782 Mixed hyperlipidemia: Secondary | ICD-10-CM | POA: Diagnosis not present

## 2024-01-24 DIAGNOSIS — I10 Essential (primary) hypertension: Secondary | ICD-10-CM | POA: Diagnosis not present

## 2024-01-29 DIAGNOSIS — Z1331 Encounter for screening for depression: Secondary | ICD-10-CM | POA: Diagnosis not present

## 2024-01-29 DIAGNOSIS — E1121 Type 2 diabetes mellitus with diabetic nephropathy: Secondary | ICD-10-CM | POA: Diagnosis not present

## 2024-01-29 DIAGNOSIS — E782 Mixed hyperlipidemia: Secondary | ICD-10-CM | POA: Diagnosis not present

## 2024-01-29 DIAGNOSIS — N529 Male erectile dysfunction, unspecified: Secondary | ICD-10-CM | POA: Diagnosis not present

## 2024-01-29 DIAGNOSIS — Z0000 Encounter for general adult medical examination without abnormal findings: Secondary | ICD-10-CM | POA: Diagnosis not present

## 2024-01-29 DIAGNOSIS — E66811 Obesity, class 1: Secondary | ICD-10-CM | POA: Diagnosis not present

## 2024-02-13 DIAGNOSIS — M51379 Other intervertebral disc degeneration, lumbosacral region without mention of lumbar back pain or lower extremity pain: Secondary | ICD-10-CM | POA: Diagnosis not present

## 2024-02-13 DIAGNOSIS — M25551 Pain in right hip: Secondary | ICD-10-CM | POA: Diagnosis not present

## 2024-02-13 DIAGNOSIS — M545 Low back pain, unspecified: Secondary | ICD-10-CM | POA: Diagnosis not present

## 2024-02-13 DIAGNOSIS — Z96643 Presence of artificial hip joint, bilateral: Secondary | ICD-10-CM | POA: Diagnosis not present

## 2024-03-03 DIAGNOSIS — L738 Other specified follicular disorders: Secondary | ICD-10-CM | POA: Diagnosis not present

## 2024-03-03 DIAGNOSIS — L821 Other seborrheic keratosis: Secondary | ICD-10-CM | POA: Diagnosis not present

## 2024-03-03 DIAGNOSIS — L570 Actinic keratosis: Secondary | ICD-10-CM | POA: Diagnosis not present

## 2024-04-20 DIAGNOSIS — E1121 Type 2 diabetes mellitus with diabetic nephropathy: Secondary | ICD-10-CM | POA: Diagnosis not present

## 2024-04-28 DIAGNOSIS — E1121 Type 2 diabetes mellitus with diabetic nephropathy: Secondary | ICD-10-CM | POA: Diagnosis not present

## 2024-04-28 DIAGNOSIS — Z6833 Body mass index (BMI) 33.0-33.9, adult: Secondary | ICD-10-CM | POA: Diagnosis not present

## 2024-07-15 DIAGNOSIS — D2261 Melanocytic nevi of right upper limb, including shoulder: Secondary | ICD-10-CM | POA: Diagnosis not present

## 2024-07-15 DIAGNOSIS — L738 Other specified follicular disorders: Secondary | ICD-10-CM | POA: Diagnosis not present

## 2024-07-15 DIAGNOSIS — L821 Other seborrheic keratosis: Secondary | ICD-10-CM | POA: Diagnosis not present

## 2024-07-15 DIAGNOSIS — L570 Actinic keratosis: Secondary | ICD-10-CM | POA: Diagnosis not present

## 2024-07-15 DIAGNOSIS — D225 Melanocytic nevi of trunk: Secondary | ICD-10-CM | POA: Diagnosis not present

## 2024-07-28 DIAGNOSIS — E1121 Type 2 diabetes mellitus with diabetic nephropathy: Secondary | ICD-10-CM | POA: Diagnosis not present

## 2024-07-29 DIAGNOSIS — R350 Frequency of micturition: Secondary | ICD-10-CM | POA: Diagnosis not present

## 2024-07-29 DIAGNOSIS — E1121 Type 2 diabetes mellitus with diabetic nephropathy: Secondary | ICD-10-CM | POA: Diagnosis not present

## 2024-07-29 DIAGNOSIS — E66811 Obesity, class 1: Secondary | ICD-10-CM | POA: Diagnosis not present

## 2024-07-29 DIAGNOSIS — Z23 Encounter for immunization: Secondary | ICD-10-CM | POA: Diagnosis not present

## 2024-08-27 DIAGNOSIS — K080 Exfoliation of teeth due to systemic causes: Secondary | ICD-10-CM | POA: Diagnosis not present
